# Patient Record
Sex: Female | Born: 1990 | Race: Black or African American | Hispanic: No | Marital: Married | Smoking: Never smoker
Health system: Southern US, Community
[De-identification: ages and names within clinical notes are randomized; demographics above are authoritative.]

## PROBLEM LIST (undated history)

## (undated) ENCOUNTER — Inpatient Hospital Stay: Payer: Self-pay

## (undated) DIAGNOSIS — C50919 Malignant neoplasm of unspecified site of unspecified female breast: Secondary | ICD-10-CM

## (undated) DIAGNOSIS — D649 Anemia, unspecified: Secondary | ICD-10-CM

## (undated) DIAGNOSIS — J309 Allergic rhinitis, unspecified: Secondary | ICD-10-CM

## (undated) DIAGNOSIS — Z17421 Hormone receptor negative with human epidermal growth factor receptor 2 negative status: Secondary | ICD-10-CM

## (undated) DIAGNOSIS — I7300 Raynaud's syndrome without gangrene: Secondary | ICD-10-CM

## (undated) DIAGNOSIS — E538 Deficiency of other specified B group vitamins: Secondary | ICD-10-CM

## (undated) DIAGNOSIS — Z9011 Acquired absence of right breast and nipple: Secondary | ICD-10-CM

## (undated) DIAGNOSIS — R911 Solitary pulmonary nodule: Secondary | ICD-10-CM

## (undated) DIAGNOSIS — D569 Thalassemia, unspecified: Secondary | ICD-10-CM

## (undated) DIAGNOSIS — K769 Liver disease, unspecified: Secondary | ICD-10-CM

## (undated) DIAGNOSIS — D563 Thalassemia minor: Secondary | ICD-10-CM

## (undated) HISTORY — DX: Allergic rhinitis, unspecified: J30.9

## (undated) HISTORY — DX: Raynaud's syndrome without gangrene: I73.00

## (undated) HISTORY — DX: Thalassemia, unspecified: D56.9

## (undated) HISTORY — PX: NO PAST SURGERIES: SHX2092

## (undated) HISTORY — DX: Anemia, unspecified: D64.9

---

## 2008-01-28 ENCOUNTER — Emergency Department: Payer: Self-pay | Admitting: Emergency Medicine

## 2008-09-23 ENCOUNTER — Emergency Department: Payer: Self-pay | Admitting: Emergency Medicine

## 2008-10-03 ENCOUNTER — Emergency Department: Payer: Self-pay | Admitting: Emergency Medicine

## 2008-10-23 ENCOUNTER — Emergency Department: Payer: Self-pay | Admitting: Emergency Medicine

## 2009-01-03 ENCOUNTER — Ambulatory Visit: Payer: Self-pay | Admitting: Family Medicine

## 2009-05-15 ENCOUNTER — Observation Stay: Payer: Self-pay | Admitting: Obstetrics and Gynecology

## 2009-05-18 ENCOUNTER — Inpatient Hospital Stay: Payer: Self-pay | Admitting: Obstetrics and Gynecology

## 2009-06-20 ENCOUNTER — Emergency Department: Payer: Self-pay | Admitting: Emergency Medicine

## 2009-06-27 ENCOUNTER — Emergency Department: Payer: Self-pay | Admitting: Emergency Medicine

## 2011-01-16 ENCOUNTER — Emergency Department: Payer: Self-pay | Admitting: Unknown Physician Specialty

## 2011-02-18 IMAGING — US US OB US >=[ID] SNGL FETUS
1 series · 17 of 28 positions shown · non-contrast
Comparison: none

REASON FOR EXAM: DATES
COMMENTS:

[Series 1: us ob us >=(id) sngl fetus · 17 of 53 slices shown]
[im 1/53]
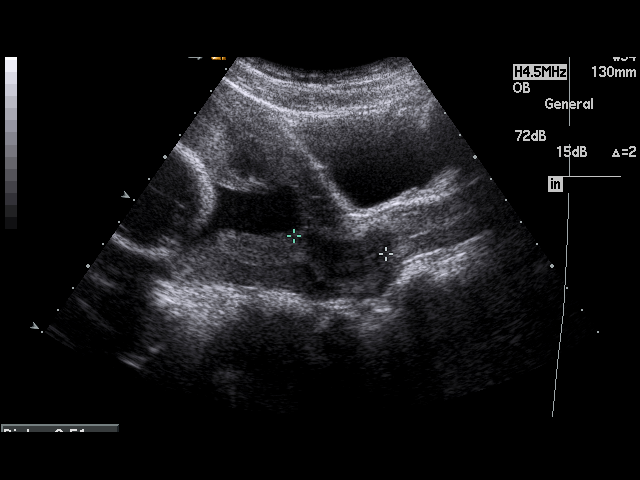
[im 4/53]
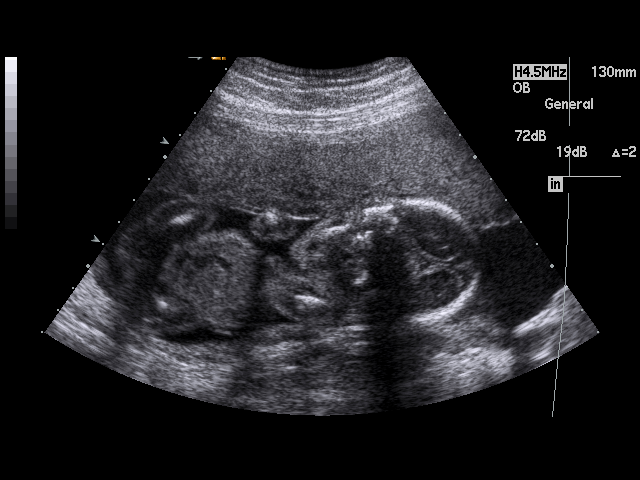
[im 8/53]
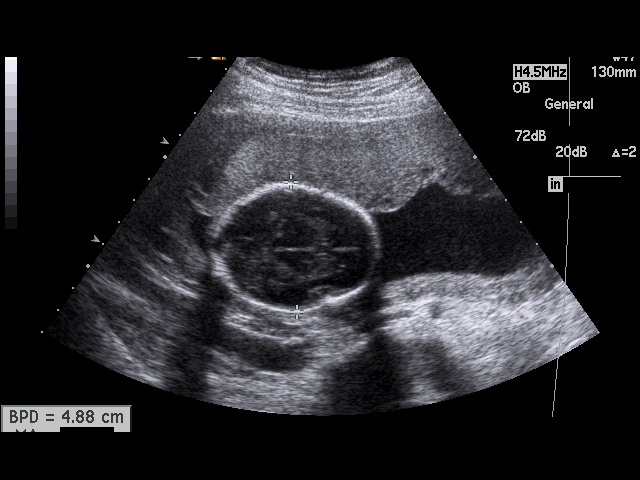
[im 10/53]
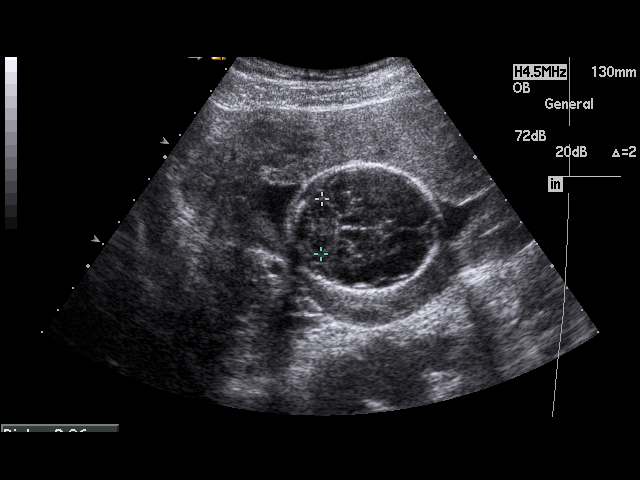
[im 14/53]
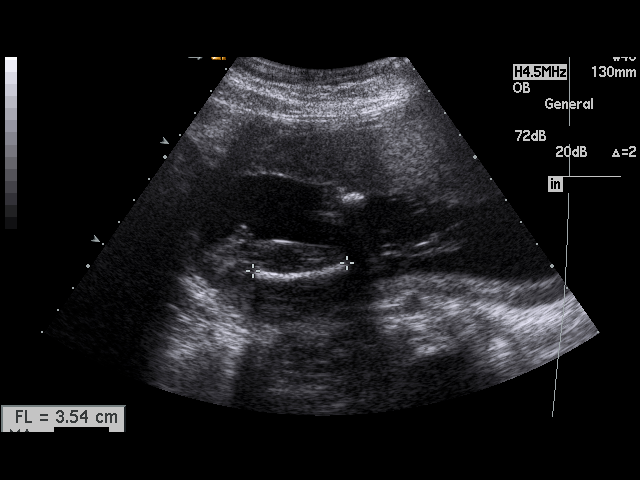
[im 18/53]
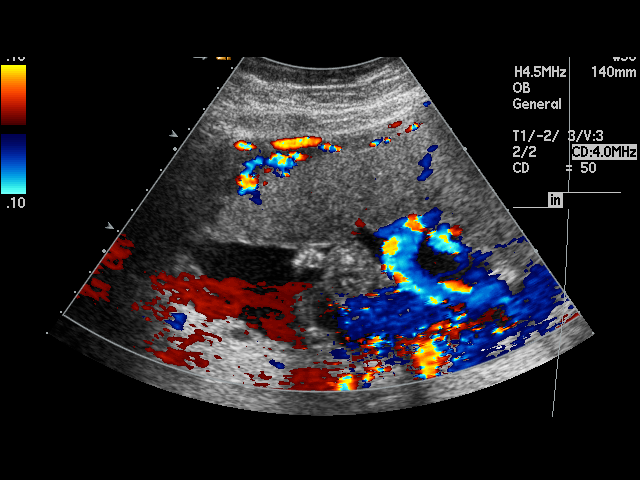
[im 20/53]
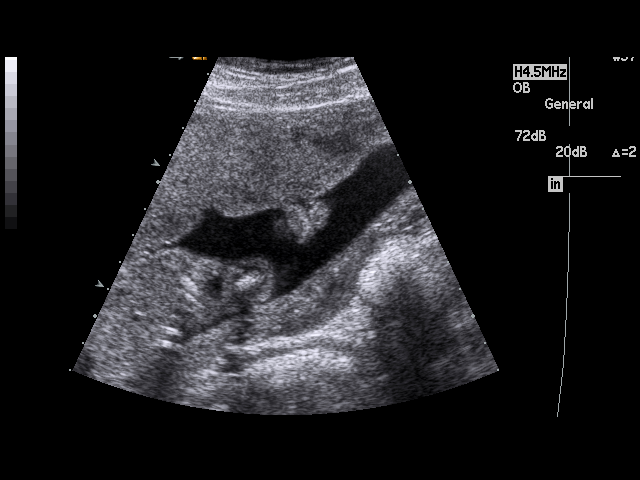
[im 24/53]
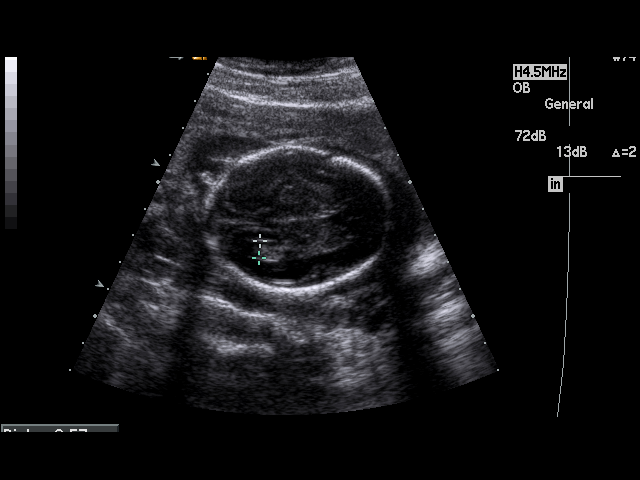
[im 27/53]
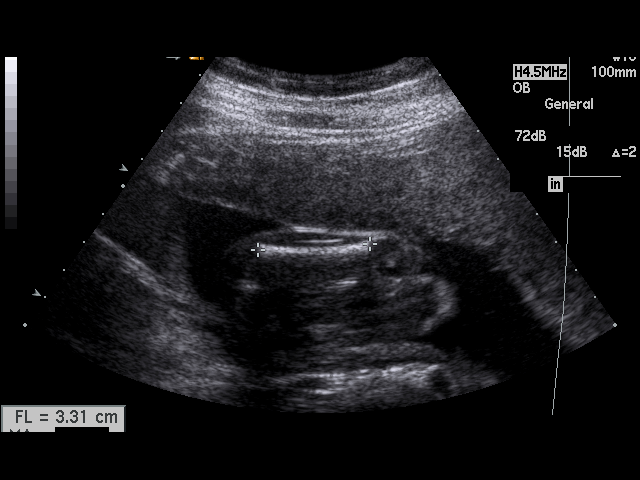
[im 29/53]
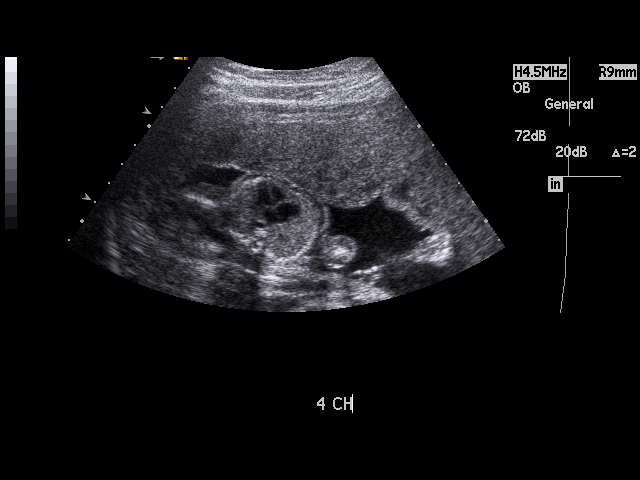
[im 33/53]
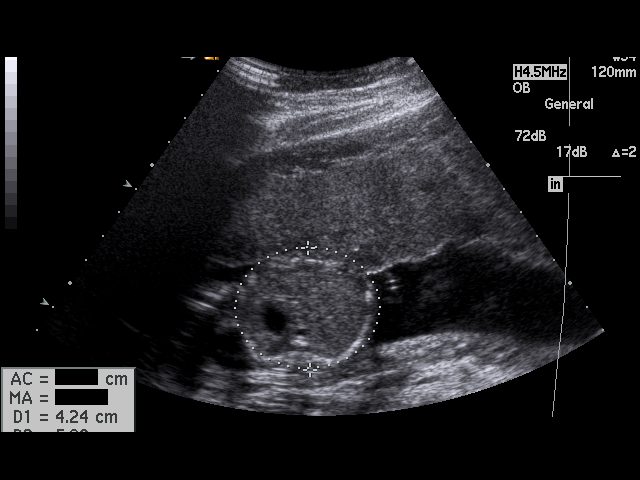
[im 35/53]
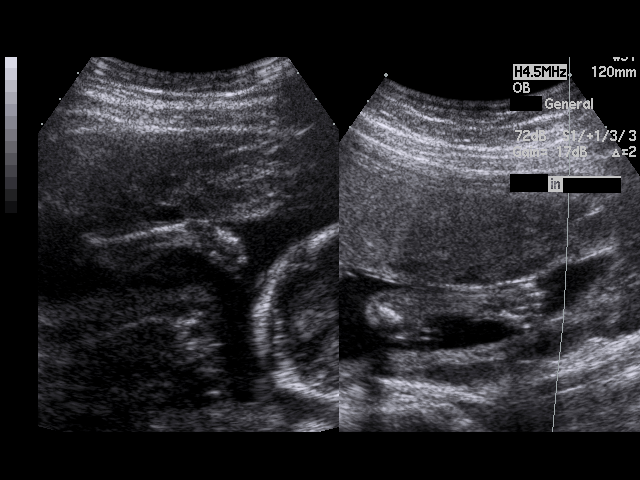
[im 39/53]
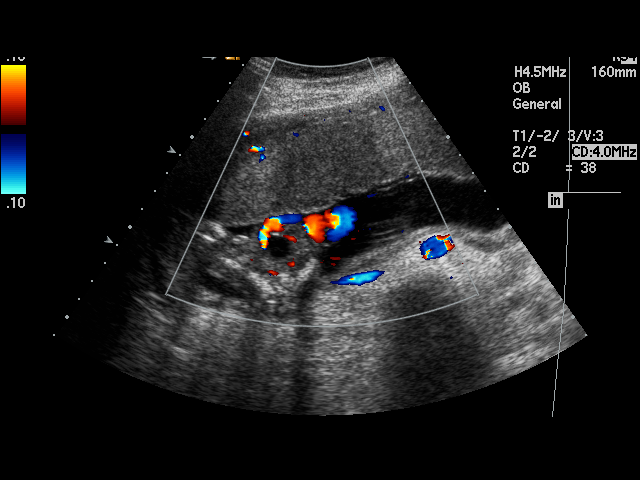
[im 43/53]
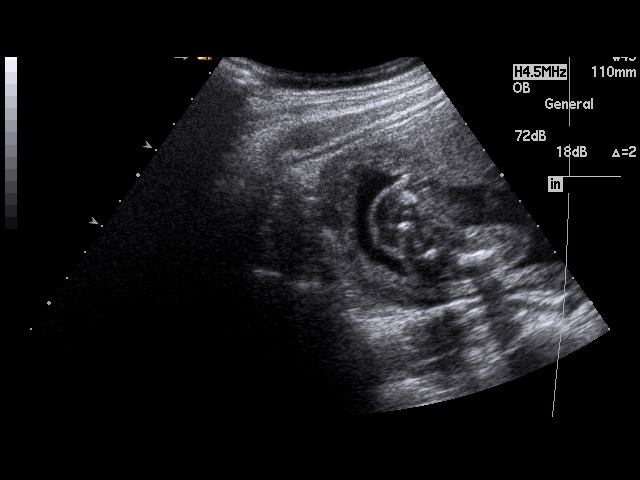
[im 45/53]
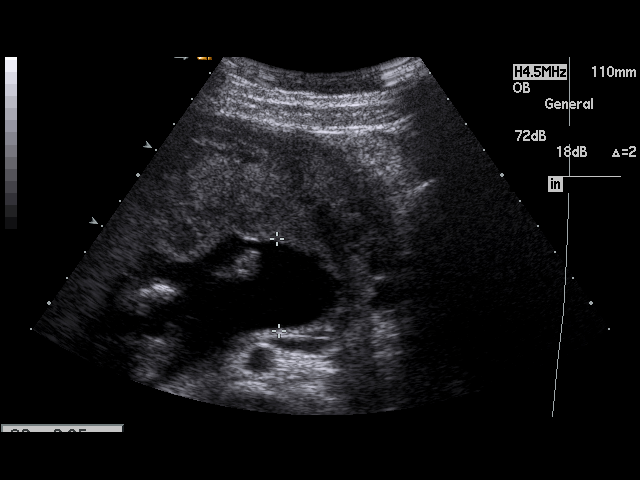
[im 49/53]
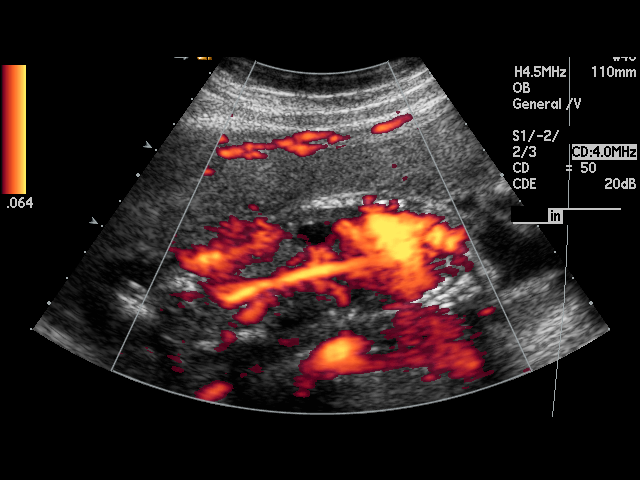
[im 53/53]
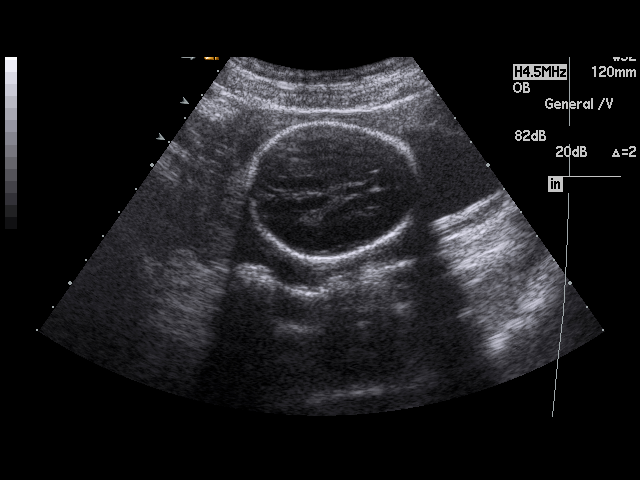

[17 of 28 positions shown; findings below may reference images not displayed]

PROCEDURE:     US  - US OB GREATER/OR EQUAL TO 5T80M  - January 03, 2009  [DATE]

RESULT:     There is a viable IUP with cephalic presentation. The placenta
is anterior with no evidence of previa. The amniotic fluid volume is
estimated to be normal. Fetal cardiac activity is rate of 156 beats per
minute was demonstrated.

The fetal stomach, kidneys, and four-chamber heart were demonstrated and
appeared normal. Evaluation of the spine was limited but no gross
abnormalities were demonstrated. The intracranial structures appear normal.

Measured parameters:
BPD 4.86 cm corresponding to an EGA of 20 weeks 5 days
HC 17.97 cm corresponding to an EGA of 20 weeks 3 days
AC 14.9 cm corresponding to an EGA of 20 weeks one day
FL 3.42 cm corresponding to an EGA of 20 weeks 5 days

The estimated fetal weight is 378 grams + / - 51 grams. No fetal anomalies are
identified.
IMPRESSION: There is a viable IUP with cephalic presentation and
estimated gestational age of 20 weeks 4 days plus or minus approximately 12
days. The estimated date of confinement is 19 May, 2009.

## 2011-04-11 ENCOUNTER — Encounter: Payer: Self-pay | Admitting: Obstetrics & Gynecology

## 2011-06-27 ENCOUNTER — Observation Stay: Payer: Self-pay | Admitting: Obstetrics and Gynecology

## 2011-06-27 LAB — URINALYSIS, COMPLETE
Bilirubin,UR: NEGATIVE
Blood: NEGATIVE
Glucose,UR: NEGATIVE mg/dL (ref 0–75)
Ketone: NEGATIVE
Nitrite: NEGATIVE
Protein: 100
RBC,UR: 1 /HPF (ref 0–5)
WBC UR: 10 /HPF (ref 0–5)

## 2011-08-08 ENCOUNTER — Ambulatory Visit: Payer: Self-pay | Admitting: Oncology

## 2011-08-08 LAB — CBC CANCER CENTER
Basophil %: 0.5 %
Eosinophil #: 0.1 x10 3/mm (ref 0.0–0.7)
HCT: 27.4 % — ABNORMAL LOW (ref 35.0–47.0)
HGB: 8.6 g/dL — ABNORMAL LOW (ref 12.0–16.0)
Lymphocyte %: 21.7 %
MCH: 21.8 pg — ABNORMAL LOW (ref 26.0–34.0)
MCV: 70 fL — ABNORMAL LOW (ref 80–100)
Monocyte %: 9.1 %
Neutrophil %: 66.8 %
Platelet: 288 x10 3/mm (ref 150–440)
RBC: 3.93 10*6/uL (ref 3.80–5.20)
RDW: 14.5 % (ref 11.5–14.5)
WBC: 5.7 x10 3/mm (ref 3.6–11.0)

## 2011-08-08 LAB — IRON AND TIBC: Unbound Iron-Bind.Cap.: 437 ug/dL

## 2011-08-13 ENCOUNTER — Inpatient Hospital Stay: Payer: Self-pay | Admitting: Obstetrics and Gynecology

## 2011-08-13 LAB — CBC WITH DIFFERENTIAL/PLATELET
Basophil #: 0 10*3/uL (ref 0.0–0.1)
Basophil %: 0.6 %
Eosinophil #: 0.1 10*3/uL (ref 0.0–0.7)
Eosinophil %: 1.1 %
HCT: 29.9 % — ABNORMAL LOW (ref 35.0–47.0)
HGB: 9.5 g/dL — ABNORMAL LOW (ref 12.0–16.0)
Lymphocyte #: 1.4 10*3/uL (ref 1.0–3.6)
Lymphocyte %: 21 %
MCH: 22.6 pg — ABNORMAL LOW (ref 26.0–34.0)
MCHC: 31.8 g/dL — ABNORMAL LOW (ref 32.0–36.0)
MCV: 71 fL — ABNORMAL LOW (ref 80–100)
Monocyte #: 0.5 x10 3/mm (ref 0.2–0.9)
Monocyte %: 7 %
Neutrophil #: 4.8 10*3/uL (ref 1.4–6.5)
Neutrophil %: 70.3 %
Platelet: 292 10*3/uL (ref 150–440)
RBC: 4.22 10*6/uL (ref 3.80–5.20)
RDW: 14.8 % — ABNORMAL HIGH (ref 11.5–14.5)
WBC: 6.9 10*3/uL (ref 3.6–11.0)

## 2011-08-14 LAB — HEMOGLOBIN: HGB: 8.8 g/dL — ABNORMAL LOW (ref 12.0–16.0)

## 2011-08-21 ENCOUNTER — Ambulatory Visit: Payer: Self-pay | Admitting: Oncology

## 2013-05-26 IMAGING — US US OB DETAIL+14 WK - NRPT [facility]
1 series · 7 of 7 positions shown · non-contrast
Comparison: none

[Series 1: us ob detail+14 wk - nrpt [facility] · 7 of 7 slices shown]
[im 1/7]
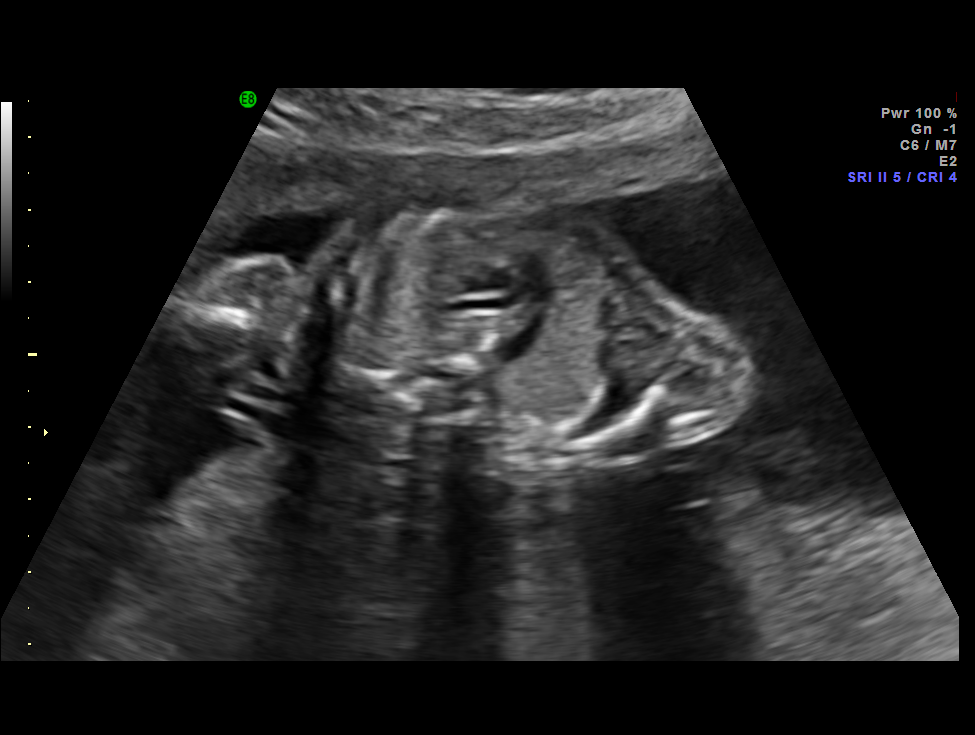
[im 2/7]
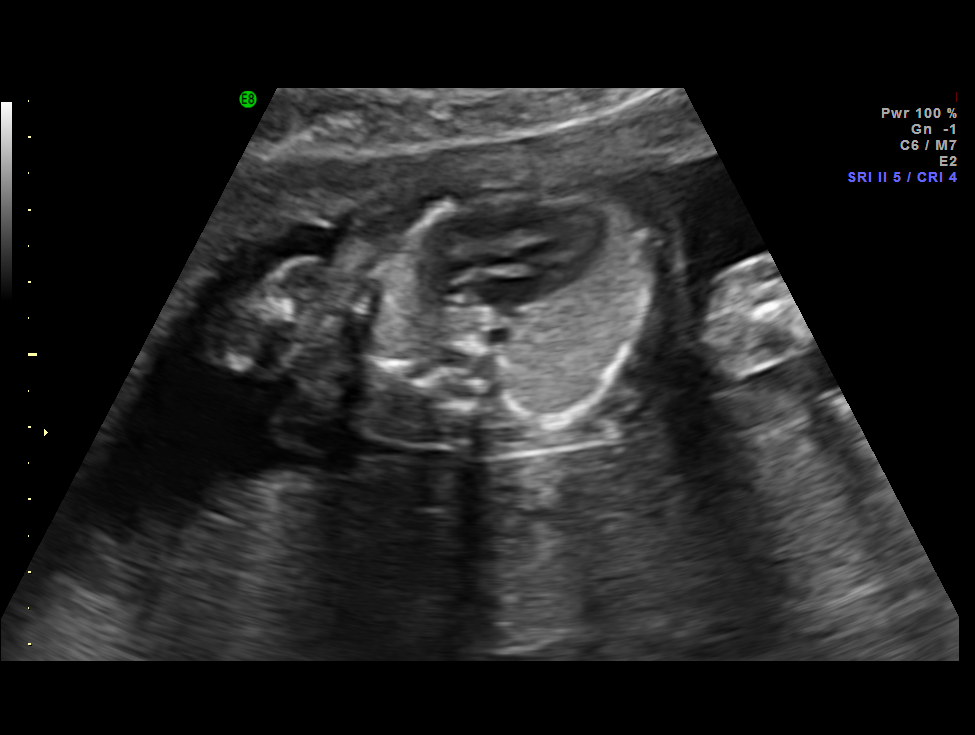
[im 3/7]
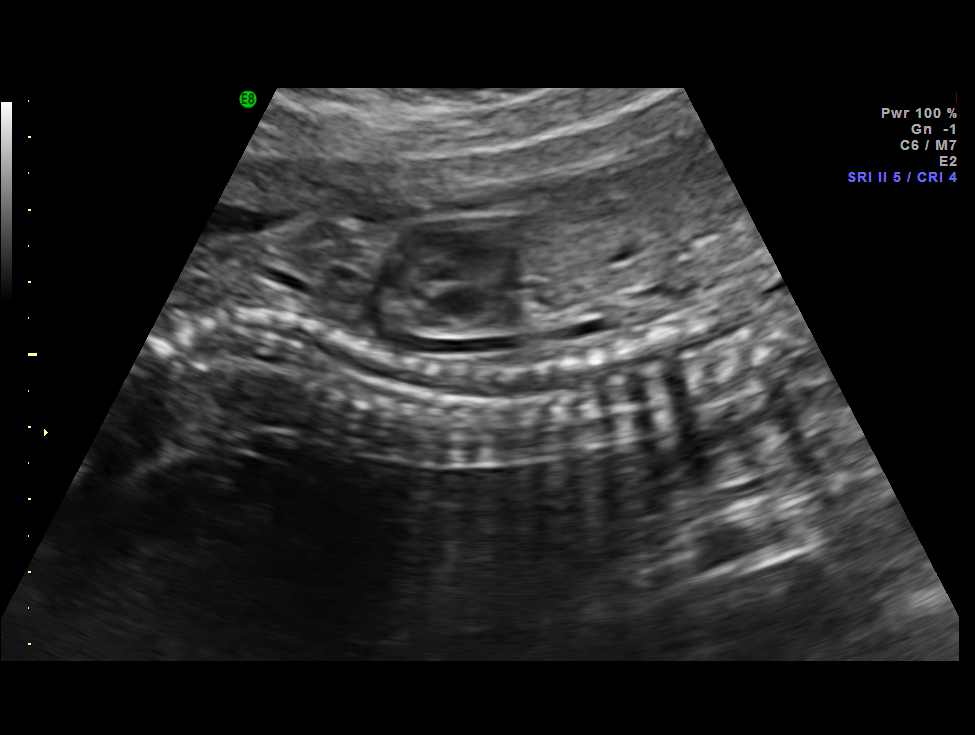
[im 4/7]
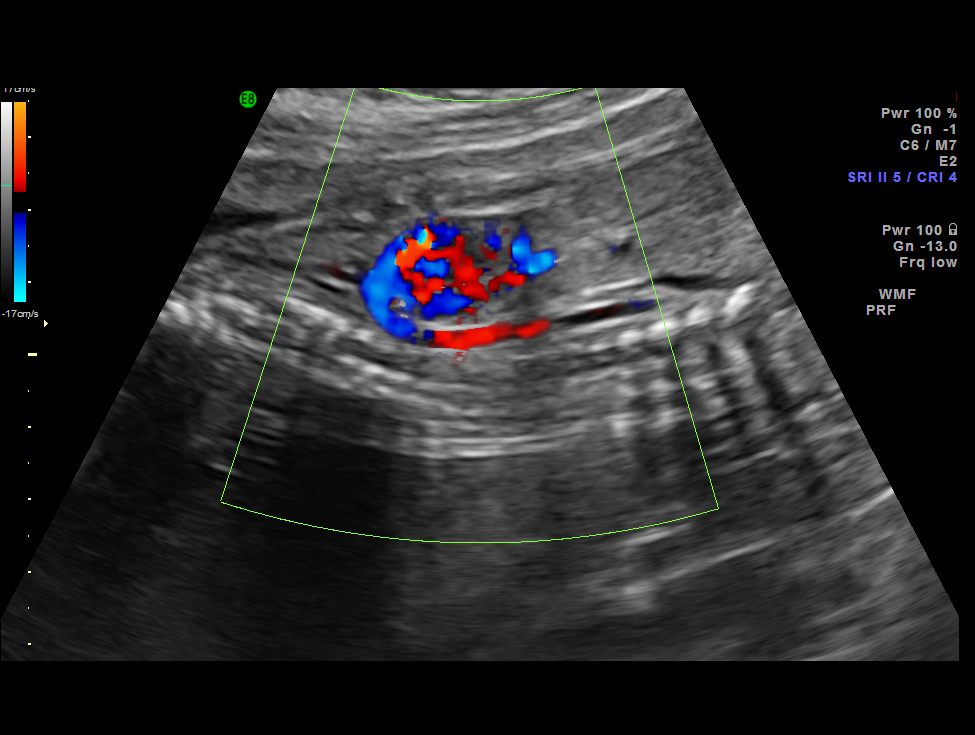
[im 5/7]
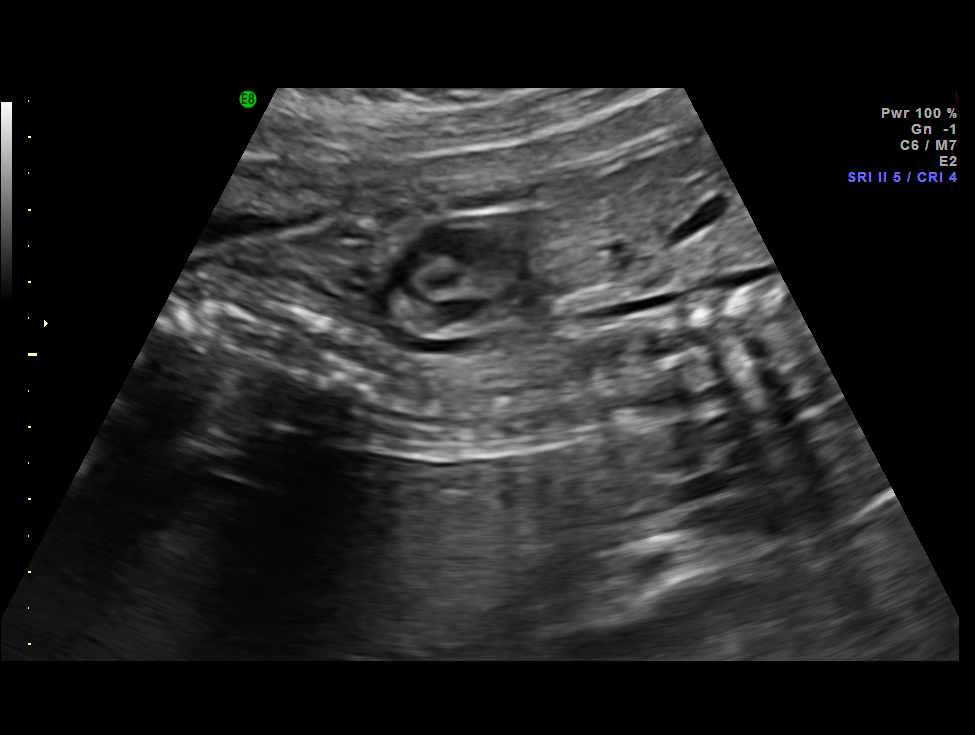
[im 6/7]
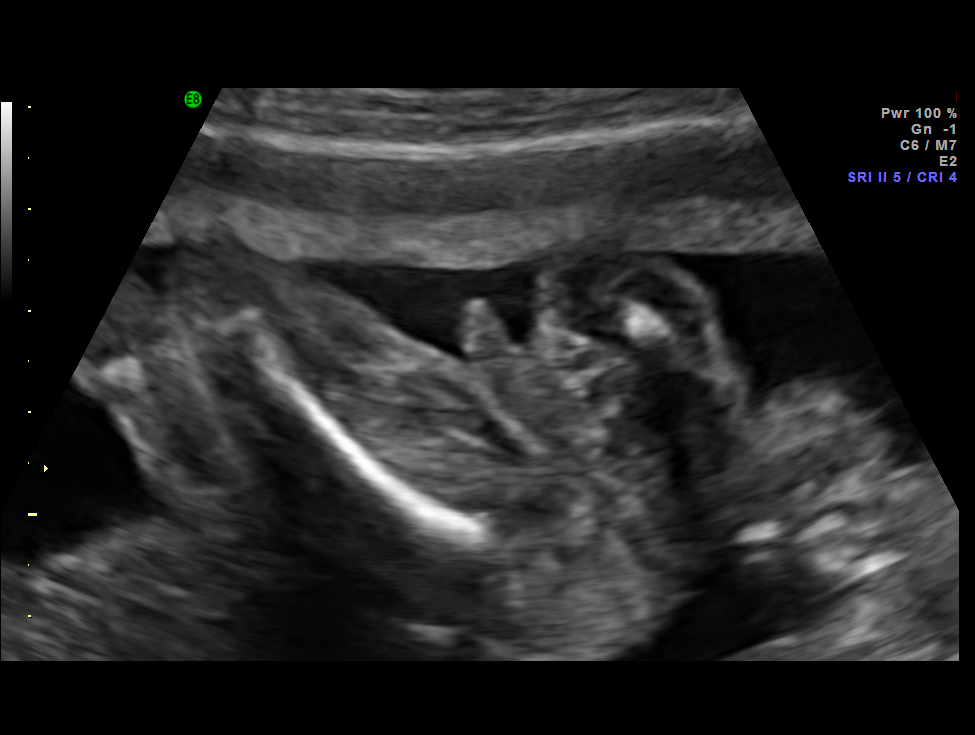
[im 7/7]
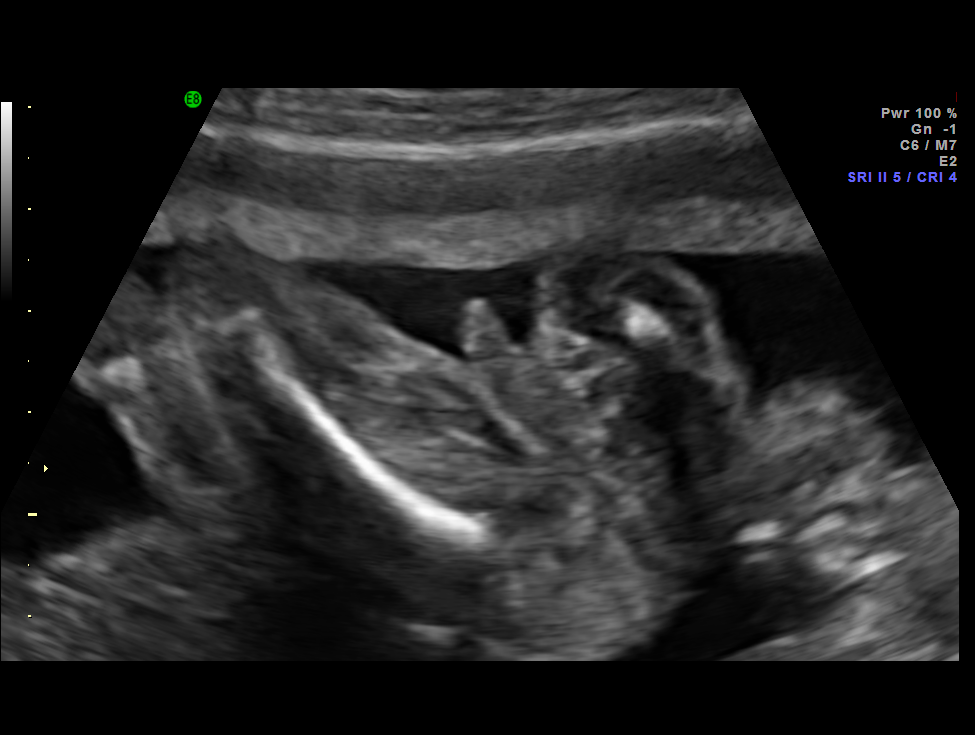

[7 of 7 positions shown; findings below may reference images not displayed]

IMAGES IMPORTED FROM THE [employer] WORKFLOW SYSTEM
NO DICTATION FOR STUDY

## 2014-07-21 ENCOUNTER — Emergency Department
Admit: 2014-07-21 | Disposition: A | Discharge status: Home / Self Care | Payer: Self-pay | Admitting: Emergency Medicine

## 2014-08-30 NOTE — H&P (Signed)
L&D Evaluation:  History:   HPI 24 y/o BF EDC 5/10  LMP 11/23/10    Presents with contractions, leaking fluid    Patient's Medical History GBS+    Patient's Surgical History none    Medications Pre Natal Vitamins    Allergies NKDA    Social History none    Family History Non-Contributory   ROS:   ROS All systems were reviewed.  HEENT, CNS, GI, GU, Respiratory, CV, Renal and Musculoskeletal systems were found to be normal.   Exam:   Vital Signs stable    Abdomen gravid, non-tender    Estimated Fetal Weight Average for gestational age    Back no CVAT    Pelvic 3/70    Mebranes Ruptured    FHT normal rate with no decels    Ucx irregular   Impression:   Impression early labor, SROM, +GBS   Plan:   Plan monitor contractions and for cervical change, antibiotics for GBBS prophylaxis   Electronic Signatures: Edison Nasuti (MD)  (Signed 23-Apr-13 13:42)  Authored: L&D Evaluation   Last Updated: 23-Apr-13 13:42 by Edison Nasuti (MD)

## 2016-09-04 IMAGING — CR DG CHEST 2V
1 series · 2 of 2 positions shown · non-contrast
Comparison: None.

CLINICAL DATA: Persistent productive cough.  Shortness of breath.

EXAM:
CHEST  2 VIEW

[Series 1: dxr chest pa (or ap) and lateral · 0.14mm/px · 2 of 2 slices shown]
[im 1/2]
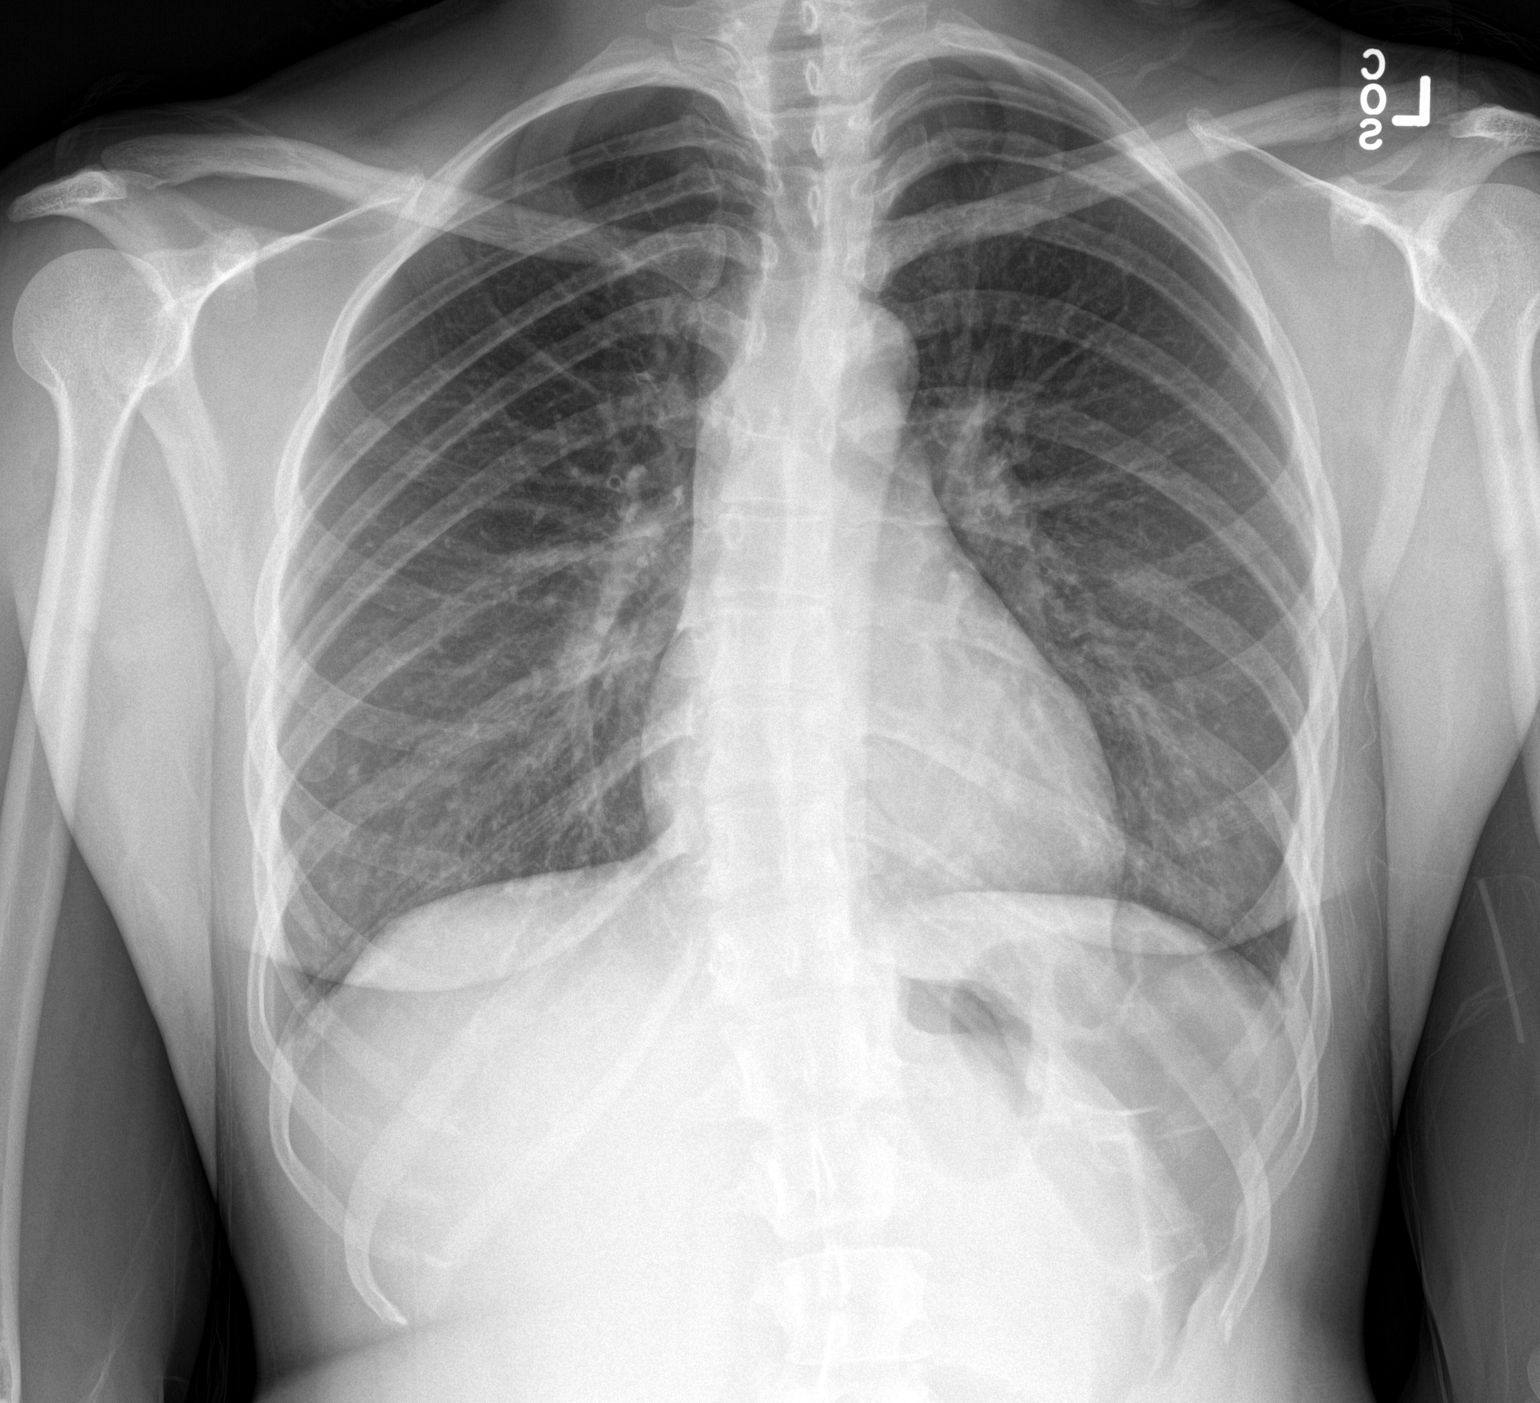
[im 2/2]
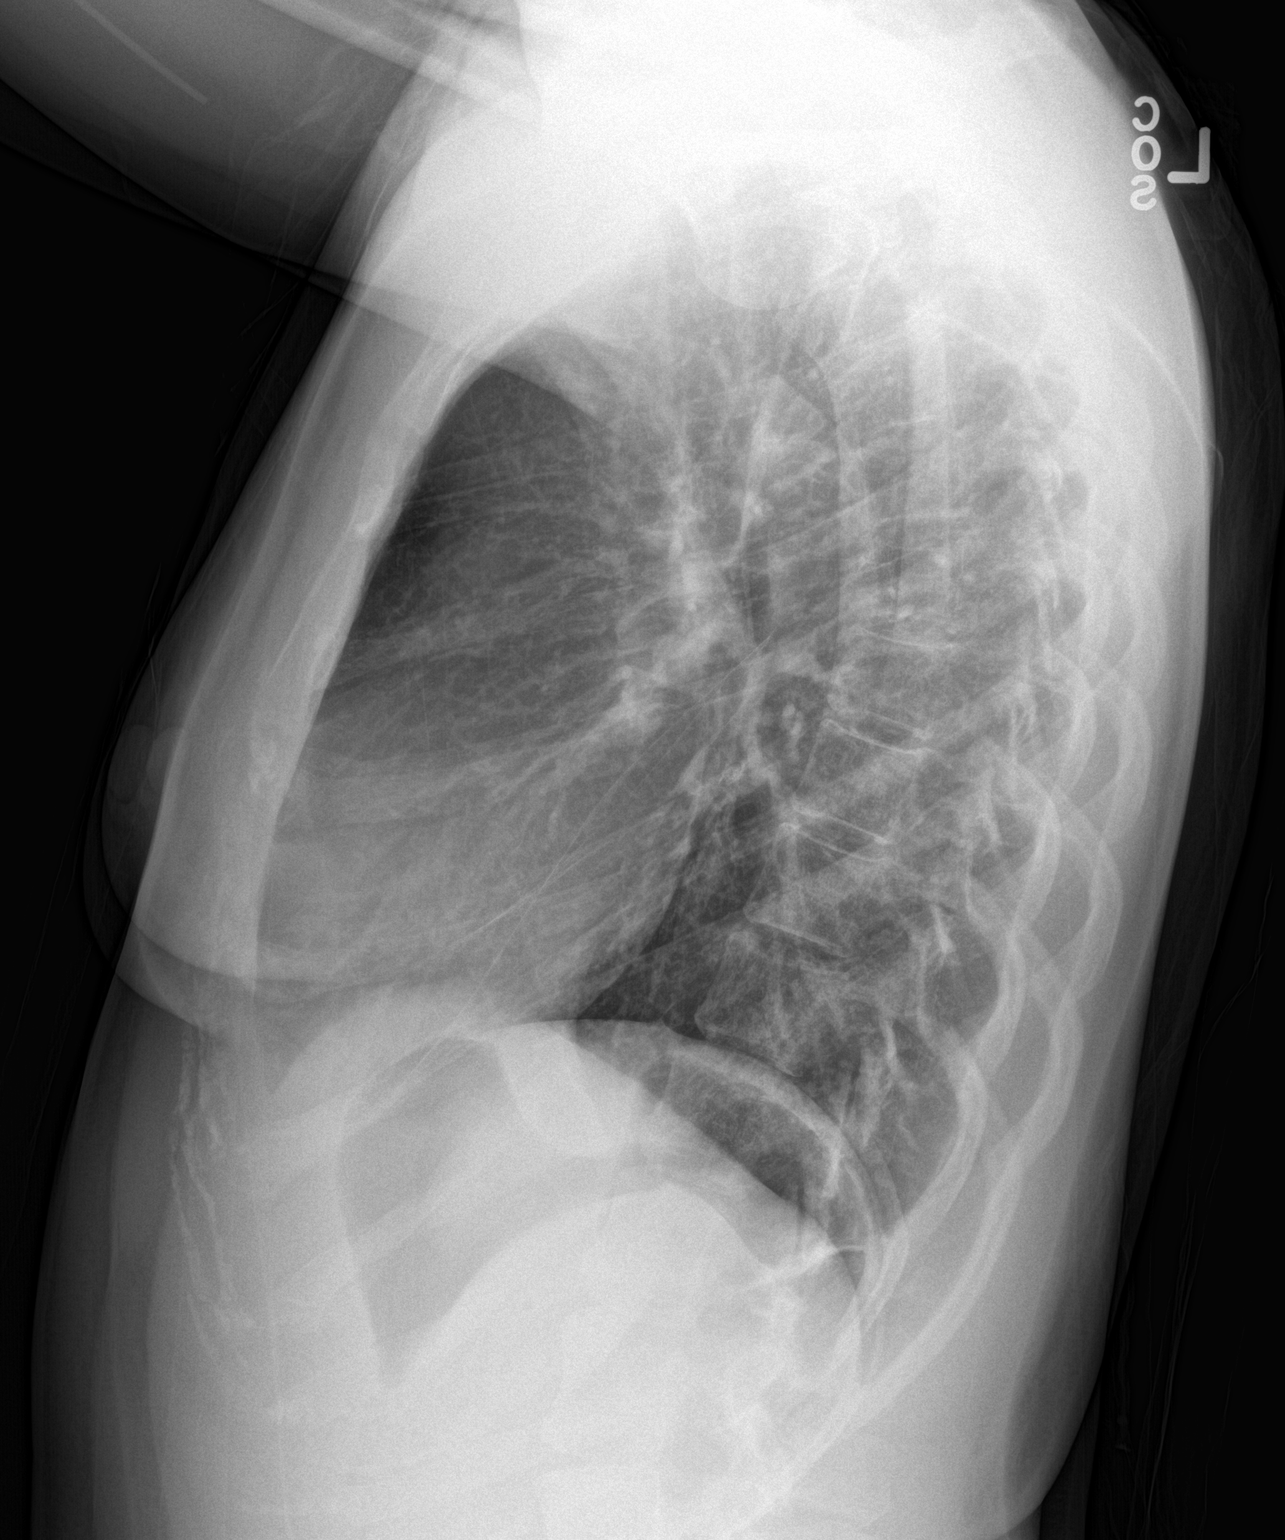

[2 of 2 positions shown; findings below may reference images not displayed]

FINDINGS: There is slight peribronchial thickening. Lungs are otherwise clear.
Heart size and vascularity are normal. No effusions. Slight
thoracolumbar scoliosis.
IMPRESSION: Bronchitic changes.

## 2017-04-16 ENCOUNTER — Emergency Department
Admission: EM | Admit: 2017-04-16 | Discharge: 2017-04-16 | Disposition: A | Discharge status: Home / Self Care | Payer: Self-pay | Attending: Emergency Medicine | Admitting: Emergency Medicine

## 2017-04-16 ENCOUNTER — Other Ambulatory Visit: Payer: Self-pay

## 2017-04-16 DIAGNOSIS — N3001 Acute cystitis with hematuria: Secondary | ICD-10-CM | POA: Insufficient documentation

## 2017-04-16 LAB — URINALYSIS, COMPLETE (UACMP) WITH MICROSCOPIC
BACTERIA UA: NONE SEEN
Bilirubin Urine: NEGATIVE
GLUCOSE, UA: NEGATIVE mg/dL
KETONES UR: NEGATIVE mg/dL
NITRITE: NEGATIVE
PROTEIN: NEGATIVE mg/dL
Specific Gravity, Urine: 1.009 (ref 1.005–1.030)
pH: 6 (ref 5.0–8.0)

## 2017-04-16 LAB — POCT PREGNANCY, URINE: PREG TEST UR: NEGATIVE

## 2017-04-16 MED ORDER — PHENAZOPYRIDINE HCL 100 MG PO TABS: 100.0000 mg | ORAL_TABLET | Freq: Three times a day (TID) | ORAL | 0 refills | Status: DC | PRN

## 2017-04-16 MED ORDER — SULFAMETHOXAZOLE-TRIMETHOPRIM 800-160 MG PO TABS: 1.0000 | ORAL_TABLET | Freq: Two times a day (BID) | ORAL | 0 refills | Status: DC

## 2017-04-16 NOTE — ED Notes (Signed)
Pt discharged home after verbalizing understanding of discharge instructions; nad noted. 

## 2017-04-16 NOTE — ED Notes (Signed)
Pt presents with urinary frequency and pain x 2 days. Denies hx of uti, other symptoms. Pt alert & oriented with NAD noted.

## 2017-04-16 NOTE — ED Triage Notes (Signed)
Pt c/o increased urinary frequency with painful urination for the past 2 days.

## 2017-04-16 NOTE — ED Provider Notes (Signed)
[facility] Emergency Department Provider Note   ____________________________________________   First MD Initiated Contact with Patient 04/16/17 [phone]     (approximate)  I have reviewed the triage vital signs and the nursing notes.   HISTORY  Chief Complaint Urinary Frequency   HPI Angel Pacheco is a 26 y.o. female is here with complaint of urinary frequency beginning 2 days ago. Patient states she is also having pain with urination. She states that it one year ago she did have a urinary tract infection. She states this feels very similar. She denies any fever, chills, nausea or vomiting. She denies any back pain.she rates her pain as 6/10.   History reviewed. No pertinent past medical history.  There are no active problems to display for this patient.   History reviewed. No pertinent surgical history.  Prior to Admission medications   Medication Sig Start Date End Date Taking? Authorizing Provider  phenazopyridine (PYRIDIUM) 100 MG tablet Take 1 tablet (100 mg total) by mouth 3 (three) times daily as needed for pain. 04/16/17 04/16/18  Johnn Hai, PA-C  sulfamethoxazole-trimethoprim (BACTRIM DS,SEPTRA DS) 800-160 MG tablet Take 1 tablet by mouth 2 (two) times daily. 04/16/17   Johnn Hai, PA-C    Allergies Patient has no known allergies.  No family history on file.  Social History Social History   Tobacco Use  . Smoking status: Never Smoker  . Smokeless tobacco: Never Used  Substance Use Topics  . Alcohol use: No    Frequency: Never  . Drug use: No    Review of Systems Constitutional: No fever/chills Cardiovascular: Denies chest pain. Respiratory: Denies shortness of breath. Gastrointestinal: No abdominal pain.  No nausea, no vomiting.   Genitourinary: positive for dysuria. Musculoskeletal: Negative for back pain. Skin: Negative for rash. Neurological: Negative for  headaches. ___________________________________________   PHYSICAL EXAM:  VITAL SIGNS: ED Triage Vitals [04/16/17 0812]  Enc Vitals Group     BP 134/84     Pulse Rate 79     Resp 17     Temp 97.9 F (36.6 C)     Temp Source Oral     SpO2 100 %     Weight 125 lb (56.7 kg)     Height 5\' 1"  (1.549 m)     Head Circumference      Peak Flow      Pain Score 6     Pain Loc      Pain Edu?      Excl. in [place]?    Constitutional: Alert and oriented. Well appearing and in no acute distress. Eyes: Conjunctivae are normal.  Head: Atraumatic. Neck: No stridor.   Cardiovascular: Normal rate, regular rhythm. Grossly normal heart sounds.  Good peripheral circulation. Respiratory: Normal respiratory effort.  No retractions. Lungs CTAB. Gastrointestinal: Soft and nontender. No distention.  No CVA tenderness. Musculoskeletal: normal gait was noted. Neurologic:  Normal speech and language. No gross focal neurologic deficits are appreciated.  Skin:  Skin is warm, dry and intact. No rash noted. Psychiatric: Mood and affect are normal. Speech and behavior are normal.  ____________________________________________   LABS (all labs ordered are listed, but only abnormal results are displayed)  Labs Reviewed  URINALYSIS, COMPLETE (UACMP) WITH MICROSCOPIC - Abnormal; Notable for the following components:      Result Value   Color, Urine YELLOW (*)    APPearance CLEAR (*)    Hgb urine dipstick MODERATE (*)    Leukocytes, UA LARGE (*)  Squamous Epithelial / LPF 0-5 (*)    All other components within normal limits  POC URINE PREG, ED  POCT PREGNANCY, URINE    PROCEDURES  Procedure(s) performed: None  Procedures  Critical Care performed: No  ____________________________________________   INITIAL IMPRESSION / ASSESSMENT AND PLAN / ED COURSE Patient is given a prescription for Bactrim DS twice a day for 10 days and Pyridium 100 mg 1 tablet  3 times a day for dysuria. Patient is aware that  this will discolor her urine. She'll increase fluids and take Tylenol if needed for pain. She'll return to the emergency department if any severe worsening of her symptoms. ____________________________________________   FINAL CLINICAL IMPRESSION(S) / ED DIAGNOSES  Final diagnoses:  Acute cystitis with hematuria     ED Discharge Orders        Ordered    sulfamethoxazole-trimethoprim (BACTRIM DS,SEPTRA DS) 800-160 MG tablet  2 times daily     04/16/17 0913    phenazopyridine (PYRIDIUM) 100 MG tablet  3 times daily PRN     04/16/17 0913       Note:  This document was prepared using Dragon voice recognition software and may include unintentional dictation errors.    Johnn Hai, PA-C 04/16/17 1334    Harvest Dark, MD 04/16/17 1836

## 2017-04-16 NOTE — Discharge Instructions (Signed)
Increase fluids. Begin taking antibiotic as directed for the next 10 days. Pyridium 1 tablet 3 times a day which will follow up with frequency and pain. This medication will cause your urine to discolor. This is temporary.  Follow-up with your primary care provider or [facility] if any continued problems.

## 2017-10-03 ENCOUNTER — Encounter: Payer: Self-pay | Admitting: Emergency Medicine

## 2017-10-03 ENCOUNTER — Other Ambulatory Visit: Payer: Self-pay

## 2017-10-03 ENCOUNTER — Emergency Department
Admission: EM | Admit: 2017-10-03 | Discharge: 2017-10-03 | Disposition: A | Discharge status: Home / Self Care | Payer: Self-pay | Attending: Emergency Medicine | Admitting: Emergency Medicine

## 2017-10-03 DIAGNOSIS — N3001 Acute cystitis with hematuria: Secondary | ICD-10-CM | POA: Insufficient documentation

## 2017-10-03 LAB — URINALYSIS, COMPLETE (UACMP) WITH MICROSCOPIC
BILIRUBIN URINE: NEGATIVE
GLUCOSE, UA: NEGATIVE mg/dL
KETONES UR: NEGATIVE mg/dL
Nitrite: NEGATIVE
PH: 6 (ref 5.0–8.0)
Protein, ur: NEGATIVE mg/dL
SPECIFIC GRAVITY, URINE: 1.001 — AB (ref 1.005–1.030)

## 2017-10-03 LAB — POCT PREGNANCY, URINE: Preg Test, Ur: NEGATIVE

## 2017-10-03 MED ORDER — CEPHALEXIN 500 MG PO CAPS: 500.0000 mg | ORAL_CAPSULE | Freq: Two times a day (BID) | ORAL | 0 refills | Status: DC

## 2017-10-03 MED ORDER — CEPHALEXIN 500 MG PO CAPS: 500.0000 mg | ORAL_CAPSULE | Freq: Two times a day (BID) | ORAL | 0 refills | Status: AC

## 2017-10-03 MED ORDER — CEPHALEXIN 500 MG PO CAPS
500.0000 mg | ORAL_CAPSULE | Freq: Once | ORAL | Status: AC
  Administered 2017-10-03: 500 mg via ORAL
  Filled 2017-10-03: qty 1

## 2017-10-03 MED ORDER — PHENAZOPYRIDINE HCL 100 MG PO TABS
95.0000 mg | ORAL_TABLET | Freq: Once | ORAL | Status: AC
  Administered 2017-10-03: 100 mg via ORAL
  Filled 2017-10-03: qty 1

## 2017-10-03 MED ORDER — PHENAZOPYRIDINE HCL 100 MG PO TABS
100.0000 mg | ORAL_TABLET | Freq: Three times a day (TID) | ORAL | 0 refills | Status: AC | PRN
Start: 2017-10-03 — End: 2017-10-05

## 2017-10-03 MED ORDER — PHENAZOPYRIDINE HCL 200 MG PO TABS
ORAL_TABLET | ORAL | Status: AC
  Filled 2017-10-03: qty 1

## 2017-10-03 MED ORDER — PHENAZOPYRIDINE HCL 100 MG PO TABS: 100.0000 mg | ORAL_TABLET | Freq: Three times a day (TID) | ORAL | 0 refills | Status: DC | PRN

## 2017-10-03 NOTE — ED Notes (Signed)
See triage note  Thinks she has a UTI  Developed some dysuria and freq couple of days ago

## 2017-10-03 NOTE — ED Provider Notes (Signed)
[facility] Emergency Department Provider Note  ____________________________________________  Time seen: Approximately 6:20 PM  I have reviewed the triage vital signs and the nursing notes.   HISTORY  Chief Complaint No chief complaint on file.    HPI Angel Pacheco is a 27 y.o. female presents emergency department for evaluation of dysuria, urgency, frequency for 4 days.  Patient noticed blood in her urine this morning.  Last menstrual period was last month.  She has been using a new feminine wash.  She has never had a kidney stone.  She feels like she has a urinary tract infection.  Last UTI was in December and was "worse." No fever, chills, nausea, vomiting, abdominal pain, vaginal discharge.  History reviewed. No pertinent past medical history.  There are no active problems to display for this patient.   History reviewed. No pertinent surgical history.  Prior to Admission medications   Medication Sig Start Date End Date Taking? Authorizing Provider  cephALEXin (KEFLEX) 500 MG capsule Take 1 capsule (500 mg total) by mouth 2 (two) times daily for 10 days. 10/03/17 10/13/17  Laban Emperor, PA-C  phenazopyridine (PYRIDIUM) 100 MG tablet Take 1 tablet (100 mg total) by mouth 3 (three) times daily as needed for up to 2 days for pain. 10/03/17 10/05/17  Laban Emperor, PA-C  sulfamethoxazole-trimethoprim (BACTRIM DS,SEPTRA DS) 800-160 MG tablet Take 1 tablet by mouth 2 (two) times daily. 04/16/17   Johnn Hai, PA-C    Allergies Patient has no known allergies.  No family history on file.  Social History Social History   Tobacco Use  . Smoking status: Never Smoker  . Smokeless tobacco: Never Used  Substance Use Topics  . Alcohol use: No    Frequency: Never  . Drug use: No     Review of Systems  Constitutional: No fever/chills Gastrointestinal: No abdominal pain.  No nausea, no vomiting.  Musculoskeletal: Negative for musculoskeletal  pain. Skin: Negative for rash, abrasions, lacerations, ecchymosis.   ____________________________________________   PHYSICAL EXAM:  VITAL SIGNS: ED Triage Vitals  Enc Vitals Group     BP 10/03/17 1657 (!) 147/98     Pulse Rate 10/03/17 1657 90     Resp 10/03/17 1657 18     Temp 10/03/17 1657 98.1 F (36.7 C)     Temp Source 10/03/17 1657 Oral     SpO2 10/03/17 1657 100 %     Weight 10/03/17 1651 128 lb (58.1 kg)     Height 10/03/17 1651 5\' 1"  (1.549 m)     Head Circumference --      Peak Flow --      Pain Score 10/03/17 1651 9     Pain Loc --      Pain Edu? --      Excl. in [place]? --      Constitutional: Alert and oriented. Well appearing and in no acute distress. Eyes: Conjunctivae are normal. PERRL. EOMI. Head: Atraumatic. ENT:      Ears:      Nose: No congestion/rhinnorhea.      Mouth/Throat: Mucous membranes are moist.  Neck: No stridor.  Cardiovascular: Normal rate, regular rhythm.  Good peripheral circulation. Respiratory: Normal respiratory effort without tachypnea or retractions. Lungs CTAB. Good air entry to the bases with no decreased or absent breath sounds. Gastrointestinal: Bowel sounds 4 quadrants. Soft and nontender to palpation. No guarding or rigidity. No palpable masses. No distention. No CVA tenderness. Musculoskeletal: Full range of motion to all extremities. No  gross deformities appreciated. Neurologic:  Normal speech and language. No gross focal neurologic deficits are appreciated.  Skin:  Skin is warm, dry and intact. No rash noted. Psychiatric: Mood and affect are normal. Speech and behavior are normal. Patient exhibits appropriate insight and judgement.   ____________________________________________   LABS (all labs ordered are listed, but only abnormal results are displayed)  Labs Reviewed  URINALYSIS, COMPLETE (UACMP) WITH MICROSCOPIC - Abnormal; Notable for the following components:      Result Value   Color, Urine STRAW (*)     APPearance CLEAR (*)    Specific Gravity, Urine 1.001 (*)    Hgb urine dipstick LARGE (*)    Leukocytes, UA MODERATE (*)    Bacteria, UA RARE (*)    All other components within normal limits  URINE CULTURE  POC URINE PREG, ED  POCT PREGNANCY, URINE   ____________________________________________  EKG   ____________________________________________  RADIOLOGY   No results found.  ____________________________________________    PROCEDURES  Procedure(s) performed:    Procedures    Medications  phenazopyridine (PYRIDIUM) 200 MG tablet (has no administration in time range)  phenazopyridine (PYRIDIUM) tablet 100 mg (100 mg Oral Given 10/03/17 1838)  cephALEXin (KEFLEX) capsule 500 mg (500 mg Oral Given 10/03/17 1838)     ____________________________________________   INITIAL IMPRESSION / ASSESSMENT AND PLAN / ED COURSE  Pertinent labs & imaging results that were available during my care of the patient were reviewed by me and considered in my medical decision making (see chart for details).  Review of the [place] CSRS was performed in accordance of the [place] prior to dispensing any controlled drugs.     Patient's diagnosis is consistent with urinary tract infection.  Vital signs and exam are reassuring.  Urinalysis is consistent with infection.  Will be sent for culture.  We discussed imaging for kidney stone but patient is not having any back or abdominal pain and never had a kidney stone.  She will return to the emergency department for any worsening symptoms.  Patient will be discharged home with prescriptions for keflex, pyridium. Patient is to follow up with PCP as directed. Patient is given ED precautions to return to the ED for any worsening or new symptoms.     ____________________________________________  FINAL CLINICAL IMPRESSION(S) / ED DIAGNOSES  Final diagnoses:  Acute cystitis with hematuria      NEW MEDICATIONS STARTED DURING THIS VISIT:  ED  Discharge Orders        Ordered    cephALEXin (KEFLEX) 500 MG capsule  2 times daily,   Status:  Discontinued     10/03/17 1830    phenazopyridine (PYRIDIUM) 100 MG tablet  3 times daily PRN,   Status:  Discontinued     10/03/17 1830    cephALEXin (KEFLEX) 500 MG capsule  2 times daily     10/03/17 1833    phenazopyridine (PYRIDIUM) 100 MG tablet  3 times daily PRN     10/03/17 1833          This chart was dictated using voice recognition software/Dragon. Despite best efforts to proofread, errors can occur which can change the meaning. Any change was purely unintentional.    Laban Emperor, PA-C 10/03/17 1855    Darel Hong, MD 10/03/17 2134

## 2017-10-03 NOTE — ED Triage Notes (Signed)
C/O dysuria, burning with urination, frequency, hematuria.  States symptoms started late Tuesday, and symptoms have worsened.

## 2017-10-06 LAB — URINE CULTURE

## 2019-09-03 ENCOUNTER — Encounter: Payer: Self-pay | Admitting: Family Medicine

## 2019-09-03 ENCOUNTER — Ambulatory Visit (INDEPENDENT_AMBULATORY_CARE_PROVIDER_SITE_OTHER): Payer: Self-pay | Admitting: Family Medicine

## 2019-09-03 ENCOUNTER — Other Ambulatory Visit: Payer: Self-pay

## 2019-09-03 VITALS — BP 137/92 | HR 94 | Temp 97.3°F | Ht 61.0 in | Wt 139.0 lb

## 2019-09-03 DIAGNOSIS — Z0000 Encounter for general adult medical examination without abnormal findings: Secondary | ICD-10-CM | POA: Insufficient documentation

## 2019-09-03 DIAGNOSIS — D563 Thalassemia minor: Secondary | ICD-10-CM | POA: Insufficient documentation

## 2019-09-03 DIAGNOSIS — D569 Thalassemia, unspecified: Secondary | ICD-10-CM | POA: Insufficient documentation

## 2019-09-03 DIAGNOSIS — E663 Overweight: Secondary | ICD-10-CM | POA: Insufficient documentation

## 2019-09-03 DIAGNOSIS — Z114 Encounter for screening for human immunodeficiency virus [HIV]: Secondary | ICD-10-CM

## 2019-09-03 NOTE — Progress Notes (Signed)
I,Angel Pacheco,acting as a scribe for Angel Paganini, MD.,have documented all relevant documentation on the behalf of Angel Paganini, MD,as directed by  Angel Paganini, MD while in the presence of Angel Paganini, MD.  New patient visit - Annual Physical   Patient: Angel Pacheco   DOB: 10-Jun-1990   29 y.o. Female  MRN: [id number] Visit Date: 09/03/2019  Today's healthcare provider: Lavon Paganini, MD    Chief Complaint  Patient presents with  . Establish Care   Subjective    Angel Pacheco is a 29 y.o. female who presents today as a new patient to establish care.  HPI  She has no concerns today Declines pap smear today, but will return for one at a later date  Has nexplanon in place for >3 years - would like replaced.  Past Medical History:  Diagnosis Date  . Allergic rhinitis   . Anemia   . Raynaud phenomenon   . Thalassemia    Past Surgical History:  Procedure Laterality Date  . NO PAST SURGERIES     Family Status  Relation Name Status  . Mother  Alive  . Father  Deceased  . Sister  Alive  . MGM  Alive  . Mat Aunt  (Not Specified)  . Neg Hx  (Not Specified)   Family History  Problem Relation Age of Onset  . Diabetes Mother   . Stroke Father   . Gestational diabetes Sister   . Asthma Maternal Grandmother   . Breast cancer Maternal Aunt        great aunts  . Colon cancer Neg Hx    Social History   Socioeconomic History  . Marital status: Married    Spouse name: Not on file  . Number of children: 2  . Years of education: Not on file  . Highest education level: Not on file  Occupational History  . Occupation: accounting  Tobacco Use  . Smoking status: Never Smoker  . Smokeless tobacco: Never Used  Substance and Sexual Activity  . Alcohol use: No  . Drug use: No  . Sexual activity: Yes    Partners: Male    Birth control/protection: Implant  Other Topics Concern  . Not on file  Social History Narrative  . Not on file    Social Determinants of Health   Financial Resource Strain:   . Difficulty of Paying Living Expenses:   Food Insecurity:   . Worried About Charity fundraiser in the Last Year:   . Arboriculturist in the Last Year:   Transportation Needs:   . Film/video editor (Medical):   Marland Kitchen Lack of Transportation (Non-Medical):   Physical Activity:   . Days of Exercise per Week:   . Minutes of Exercise per Session:   Stress:   . Feeling of Stress :   Social Connections:   . Frequency of Communication with Friends and Family:   . Frequency of Social Gatherings with Friends and Family:   . Attends Religious Services:   . Active Member of Clubs or Organizations:   . Attends Archivist Meetings:   Marland Kitchen Marital Status:    Outpatient Medications Prior to Visit  Medication Sig  . etonogestrel (NEXPLANON) 68 MG IMPL implant 1 each by Subdermal route once.  . [DISCONTINUED] sulfamethoxazole-trimethoprim (BACTRIM DS,SEPTRA DS) 800-160 MG tablet Take 1 tablet by mouth 2 (two) times daily.   No facility-administered medications prior to visit.   No Known Allergies  Immunization History  Administered Date(s) Administered  . Tdap 06/21/2011    Health Maintenance  Topic Date Due  . HIV Screening  Never done  . PAP SMEAR-Modifier  Never done  . INFLUENZA VACCINE  11/21/2019  . TETANUS/TDAP  06/20/2021    Patient Care Team: Angel Crews, MD as PCP - General (Family Medicine)  Review of Systems  Constitutional: Positive for chills and fatigue. Negative for activity change, appetite change, diaphoresis, fever and unexpected weight change.  HENT: Negative.   Eyes: Positive for photophobia and itching. Negative for pain, discharge, redness and visual disturbance.  Respiratory: Negative.   Cardiovascular: Negative.   Gastrointestinal: Negative.   Endocrine: Positive for cold intolerance, heat intolerance and polyuria. Negative for polydipsia and polyphagia.  Genitourinary:  Positive for urgency. Negative for decreased urine volume, difficulty urinating, dyspareunia, dysuria, enuresis, flank pain, frequency, genital sores, hematuria, menstrual problem, pelvic pain, vaginal bleeding, vaginal discharge and vaginal pain.  Musculoskeletal: Negative.   Skin: Negative.   Allergic/Immunologic: Positive for environmental allergies. Negative for food allergies and immunocompromised state.  Neurological: Positive for dizziness and light-headedness. Negative for tremors, seizures, syncope, facial asymmetry, speech difficulty, weakness, numbness and headaches.  Hematological: Negative.   Psychiatric/Behavioral: Negative for agitation, behavioral problems, confusion, decreased concentration, dysphoric mood, hallucinations, self-injury, sleep disturbance and suicidal ideas. The patient is nervous/anxious. The patient is not hyperactive.     Last CBC Lab Results  Component Value Date   WBC 6.9 08/13/2011   HGB 8.8 (L) 08/14/2011   HCT 29.9 (L) 08/13/2011   MCV 71 (L) 08/13/2011   MCH 22.6 (L) 08/13/2011   RDW 14.8 (H) 08/13/2011   PLT 292 [id number]   Last metabolic panel No results found for: GLUCOSE, NA, K, CL, CO2, BUN, CREATININE, GFRNONAA, GFRAA, CALCIUM, PHOS, PROT, ALBUMIN, LABGLOB, AGRATIO, BILITOT, ALKPHOS, AST, ALT, ANIONGAP Last lipids No results found for: CHOL, HDL, LDLCALC, LDLDIRECT, TRIG, CHOLHDL Last hemoglobin A1c No results found for: HGBA1C Last thyroid functions No results found for: TSH, T3TOTAL, T4TOTAL, THYROIDAB  Objective    BP (!) 137/92 (BP Location: Right Arm, Patient Position: Sitting, Cuff Size: Normal)   Pulse 94   Temp (!) 97.3 F (36.3 C) (Temporal)   Ht 5\' 1"  (1.549 m)   Wt 139 lb (63 kg)   BMI 26.26 kg/m  Physical Exam Vitals reviewed.  Constitutional:      General: She is not in acute distress.    Appearance: Normal appearance. She is well-developed. She is not diaphoretic.  HENT:     Head: Normocephalic and atraumatic.      Right Ear: Tympanic membrane, ear canal and external ear normal.     Left Ear: Tympanic membrane, ear canal and external ear normal.     Mouth/Throat:     Pharynx: Uvula midline.  Eyes:     General: Lids are normal. No scleral icterus.    Conjunctiva/sclera: Conjunctivae normal.     Pupils: Pupils are equal, round, and reactive to light.  Neck:     Thyroid: No thyroid mass or thyromegaly.     Vascular: No carotid bruit.     Trachea: Trachea normal.  Cardiovascular:     Rate and Rhythm: Normal rate and regular rhythm.     Pulses: Normal pulses.     Heart sounds: Normal heart sounds. No murmur.  Pulmonary:     Effort: Pulmonary effort is normal. No respiratory distress.     Breath sounds: Normal breath sounds. No wheezing or rales.  Abdominal:  General: There is no distension.     Palpations: Abdomen is soft.     Tenderness: There is no abdominal tenderness. There is no guarding or rebound.  Musculoskeletal:        General: No deformity. Normal range of motion.     Cervical back: Normal range of motion and neck supple.     Right lower leg: No edema.     Left lower leg: No edema.  Lymphadenopathy:     Cervical: No cervical adenopathy.  Skin:    General: Skin is warm and dry.     Findings: No rash.  Neurological:     Mental Status: She is alert and oriented to person, place, and time. Mental status is at baseline.     Cranial Nerves: No cranial nerve deficit.  Psychiatric:        Mood and Affect: Mood normal.        Speech: Speech normal.        Behavior: Behavior normal.        Thought Content: Thought content normal.     Depression Screen PHQ 2/9 Scores 09/03/2019  PHQ - 2 Score 2  PHQ- 9 Score 9   No results found for any visits on 09/03/19.  Assessment & Plan      Problem List Items Addressed This Visit      Other   Overweight (BMI 25.0-29.9)    Discussed importance of healthy weight management Discussed diet and exercise  Screening labs today       Relevant Orders   Comprehensive metabolic panel   Lipid panel   Thalassemia    Asymptomatic Will check CBC      Relevant Orders   CBC with Differential/Platelet   Annual physical exam - Primary    Discussed wellness and screenings Up-to-date on vaccinations Encourage COVID-19 vaccine Encouraged healthy habits Check labs today Pap smear at next visit      Relevant Orders   CBC with Differential/Platelet   Comprehensive metabolic panel   Lipid panel       Return in about 1 month (around 10/04/2019) for Pap smear and Nexplanon removal and reinsertion .     I, Angel Paganini, MD, have reviewed all documentation for this visit. The documentation on 09/03/19 for the exam, diagnosis, procedures, and orders are all accurate and complete.   Bacigalupo, Dionne Bucy, MD, MPH [facility]

## 2019-09-03 NOTE — Assessment & Plan Note (Signed)
Discussed wellness and screenings Up-to-date on vaccinations Encourage COVID-19 vaccine Encouraged healthy habits Check labs today Pap smear at next visit

## 2019-09-03 NOTE — Patient Instructions (Signed)
Preventive Care 21-31 Years Old, Female Preventive care refers to visits with your health care provider and lifestyle choices that can promote health and wellness. This includes:  A yearly physical exam. This may also be called an annual well check.  Regular dental visits and eye exams.  Immunizations.  Screening for certain conditions.  Healthy lifestyle choices, such as eating a healthy diet, getting regular exercise, not using drugs or products that contain nicotine and tobacco, and limiting alcohol use. What can I expect for my preventive care visit? Physical exam Your health care provider will check your:  Height and weight. This may be used to calculate body mass index (BMI), which tells if you are at a healthy weight.  Heart rate and blood pressure.  Skin for abnormal spots. Counseling Your health care provider may ask you questions about your:  Alcohol, tobacco, and drug use.  Emotional well-being.  Home and relationship well-being.  Sexual activity.  Eating habits.  Work and work environment.  Method of birth control.  Menstrual cycle.  Pregnancy history. What immunizations do I need?  Influenza (flu) vaccine  This is recommended every year. Tetanus, diphtheria, and pertussis (Tdap) vaccine  You may need a Td booster every 10 years. Varicella (chickenpox) vaccine  You may need this if you have not been vaccinated. Human papillomavirus (HPV) vaccine  If recommended by your health care provider, you may need three doses over 6 months. Measles, mumps, and rubella (MMR) vaccine  You may need at least one dose of MMR. You may also need a second dose. Meningococcal conjugate (MenACWY) vaccine  One dose is recommended if you are age 19-21 years and a first-year college student living in a residence hall, or if you have one of several medical conditions. You may also need additional booster doses. Pneumococcal conjugate (PCV13) vaccine  You may need  this if you have certain conditions and were not previously vaccinated. Pneumococcal polysaccharide (PPSV23) vaccine  You may need one or two doses if you smoke cigarettes or if you have certain conditions. Hepatitis A vaccine  You may need this if you have certain conditions or if you travel or work in places where you may be exposed to hepatitis A. Hepatitis B vaccine  You may need this if you have certain conditions or if you travel or work in places where you may be exposed to hepatitis B. Haemophilus influenzae type b (Hib) vaccine  You may need this if you have certain conditions. You may receive vaccines as individual doses or as more than one vaccine together in one shot (combination vaccines). Talk with your health care provider about the risks and benefits of combination vaccines. What tests do I need?  Blood tests  Lipid and cholesterol levels. These may be checked every 5 years starting at age 20.  Hepatitis C test.  Hepatitis B test. Screening  Diabetes screening. This is done by checking your blood sugar (glucose) after you have not eaten for a while (fasting).  Sexually transmitted disease (STD) testing.  BRCA-related cancer screening. This may be done if you have a family history of breast, ovarian, tubal, or peritoneal cancers.  Pelvic exam and Pap test. This may be done every 3 years starting at age 21. Starting at age 30, this may be done every 5 years if you have a Pap test in combination with an HPV test. Talk with your health care provider about your test results, treatment options, and if necessary, the need for more tests.   Follow these instructions at home: Eating and drinking   Eat a diet that includes fresh fruits and vegetables, whole grains, lean protein, and low-fat dairy.  Take vitamin and mineral supplements as recommended by your health care provider.  Do not drink alcohol if: ? Your health care provider tells you not to drink. ? You are  pregnant, may be pregnant, or are planning to become pregnant.  If you drink alcohol: ? Limit how much you have to 0-1 drink a day. ? Be aware of how much alcohol is in your drink. In the [place] one drink equals one 12 oz bottle of beer (355 mL), one 5 oz glass of wine (148 mL), or one 1 oz glass of hard liquor (44 mL). Lifestyle  Take daily care of your teeth and gums.  Stay active. Exercise for at least 30 minutes on 5 or more days each week.  Do not use any products that contain nicotine or tobacco, such as cigarettes, e-cigarettes, and chewing tobacco. If you need help quitting, ask your health care provider.  If you are sexually active, practice safe sex. Use a condom or other form of birth control (contraception) in order to prevent pregnancy and STIs (sexually transmitted infections). If you plan to become pregnant, see your health care provider for a preconception visit. What's next?  Visit your health care provider once a year for a well check visit.  Ask your health care provider how often you should have your eyes and teeth checked.  Stay up to date on all vaccines. This information is not intended to replace advice given to you by your health care provider. Make sure you discuss any questions you have with your health care provider. Document Revised: 12/18/2017 Document Reviewed: 12/18/2017 Elsevier Patient Education  2020 [employer].

## 2019-09-03 NOTE — Assessment & Plan Note (Addendum)
Asymptomatic.  Will check CBC 

## 2019-09-03 NOTE — Assessment & Plan Note (Signed)
Discussed importance of healthy weight management Discussed diet and exercise Screening labs today 

## 2019-09-04 LAB — CBC WITH DIFFERENTIAL/PLATELET
Basophils Absolute: 0.1 10*3/uL (ref 0.0–0.2)
Basos: 1 %
EOS (ABSOLUTE): 0.3 10*3/uL (ref 0.0–0.4)
Eos: 4 %
Hematocrit: 33.5 % — ABNORMAL LOW (ref 34.0–46.6)
Hemoglobin: 10.4 g/dL — ABNORMAL LOW (ref 11.1–15.9)
Immature Grans (Abs): 0 10*3/uL (ref 0.0–0.1)
Immature Granulocytes: 0 %
Lymphocytes Absolute: 2.7 10*3/uL (ref 0.7–3.1)
Lymphs: 42 %
MCH: 22.7 pg — ABNORMAL LOW (ref 26.6–33.0)
MCHC: 31 g/dL — ABNORMAL LOW (ref 31.5–35.7)
MCV: 73 fL — ABNORMAL LOW (ref 79–97)
Monocytes Absolute: 0.4 10*3/uL (ref 0.1–0.9)
Monocytes: 6 %
Neutrophils Absolute: 3 10*3/uL (ref 1.4–7.0)
Neutrophils: 47 %
Platelets: 351 10*3/uL (ref 150–450)
RBC: 4.59 x10E6/uL (ref 3.77–5.28)
RDW: 14.4 % (ref 11.7–15.4)
WBC: 6.4 10*3/uL (ref 3.4–10.8)

## 2019-09-04 LAB — LIPID PANEL
Chol/HDL Ratio: 4.5 ratio — ABNORMAL HIGH (ref 0.0–4.4)
Cholesterol, Total: 236 mg/dL — ABNORMAL HIGH (ref 100–199)
HDL: 52 mg/dL (ref >39)
LDL Chol Calc (NIH): 168 mg/dL — ABNORMAL HIGH (ref 0–99)
Triglycerides: 93 mg/dL (ref 0–149)
VLDL Cholesterol Cal: 16 mg/dL (ref 5–40)

## 2019-09-04 LAB — HIV ANTIBODY (ROUTINE TESTING W REFLEX): HIV Screen 4th Generation wRfx: NONREACTIVE

## 2019-09-04 LAB — COMPREHENSIVE METABOLIC PANEL
ALT: 23 IU/L (ref 0–32)
AST: 24 IU/L (ref 0–40)
Albumin/Globulin Ratio: 1.4 (ref 1.2–2.2)
Albumin: 4.5 g/dL (ref 3.8–4.8)
Alkaline Phosphatase: 45 IU/L (ref 39–117)
BUN/Creatinine Ratio: 14 (ref 9–23)
BUN: 12 mg/dL (ref 6–20)
Bilirubin Total: 0.3 mg/dL (ref 0.0–1.2)
CO2: 18 mmol/L — ABNORMAL LOW (ref 20–29)
Calcium: 9.6 mg/dL (ref 8.7–10.2)
Chloride: 107 mmol/L — ABNORMAL HIGH (ref 96–106)
Creatinine, Ser: 0.83 mg/dL (ref 0.57–1.00)
GFR calc Af Amer: 109 mL/min/{1.73_m2} (ref >59)
GFR calc non Af Amer: 94 mL/min/{1.73_m2} (ref >59)
Globulin, Total: 3.2 g/dL (ref 1.5–4.5)
Glucose: 92 mg/dL (ref 65–99)
Potassium: 3.7 mmol/L (ref 3.5–5.2)
Sodium: 140 mmol/L (ref 134–144)
Total Protein: 7.7 g/dL (ref 6.0–8.5)

## 2019-09-07 ENCOUNTER — Telehealth: Payer: Self-pay

## 2019-09-07 NOTE — Telephone Encounter (Signed)
LMTCB, PEC may give result.  

## 2019-09-07 NOTE — Telephone Encounter (Signed)
Result viewed in MyChart on 09/07/19 at 11:34 am.

## 2019-09-07 NOTE — Telephone Encounter (Signed)
-----   Message from Virginia Crews, MD sent at 09/07/2019 10:37 AM EDT ----- Stable mild anemia related to thalassemia. Normal kidney function, liver function, electrolytes.  Negative HIV screening.  Cholesterol is high, but not in need of medication to lower it yet.  I recommend diet low in saturated fat and regular exercise - 30 min at least 5 times per week.  Will monitor annually.

## 2019-10-13 ENCOUNTER — Ambulatory Visit: Payer: Self-pay | Admitting: Family Medicine

## 2020-11-28 ENCOUNTER — Encounter: Payer: Self-pay | Admitting: Family Medicine

## 2021-01-01 ENCOUNTER — Telehealth: Payer: Self-pay | Admitting: Family Medicine

## 2021-02-06 ENCOUNTER — Encounter: Payer: Self-pay | Admitting: Obstetrics and Gynecology

## 2021-03-06 ENCOUNTER — Other Ambulatory Visit: Payer: Self-pay | Admitting: Obstetrics and Gynecology

## 2021-03-06 DIAGNOSIS — N6452 Nipple discharge: Secondary | ICD-10-CM

## 2021-03-06 DIAGNOSIS — N6311 Unspecified lump in the right breast, upper outer quadrant: Secondary | ICD-10-CM

## 2021-03-07 ENCOUNTER — Ambulatory Visit
Admission: RE | Admit: 2021-03-07 | Discharge: 2021-03-07 | Disposition: A | Discharge status: Home / Self Care | Payer: BC Managed Care – PPO | Source: Ambulatory Visit

## 2021-03-07 ENCOUNTER — Other Ambulatory Visit: Payer: Self-pay | Admitting: Obstetrics and Gynecology

## 2021-03-07 ENCOUNTER — Other Ambulatory Visit: Payer: Self-pay

## 2021-03-07 ENCOUNTER — Ambulatory Visit
Admission: RE | Admit: 2021-03-07 | Discharge: 2021-03-07 | Disposition: A | Discharge status: Home / Self Care | Payer: BC Managed Care – PPO | Source: Ambulatory Visit | Attending: Obstetrics and Gynecology | Admitting: Obstetrics and Gynecology

## 2021-03-07 DIAGNOSIS — N6452 Nipple discharge: Secondary | ICD-10-CM | POA: Diagnosis present

## 2021-03-07 DIAGNOSIS — R590 Localized enlarged lymph nodes: Secondary | ICD-10-CM

## 2021-03-07 DIAGNOSIS — N6311 Unspecified lump in the right breast, upper outer quadrant: Secondary | ICD-10-CM | POA: Diagnosis present

## 2021-03-07 DIAGNOSIS — D0511 Intraductal carcinoma in situ of right breast: Secondary | ICD-10-CM | POA: Diagnosis not present

## 2021-03-14 ENCOUNTER — Other Ambulatory Visit: Payer: Self-pay | Admitting: Pathology

## 2021-03-14 DIAGNOSIS — C50911 Malignant neoplasm of unspecified site of right female breast: Secondary | ICD-10-CM | POA: Insufficient documentation

## 2021-03-14 LAB — SURGICAL PATHOLOGY

## 2021-03-23 ENCOUNTER — Ambulatory Visit: Payer: BC Managed Care – PPO

## 2021-03-26 DIAGNOSIS — C50411 Malignant neoplasm of upper-outer quadrant of right female breast: Secondary | ICD-10-CM | POA: Insufficient documentation

## 2021-10-20 HISTORY — PX: MASTECTOMY: SHX3

## 2021-10-25 DIAGNOSIS — R911 Solitary pulmonary nodule: Secondary | ICD-10-CM | POA: Insufficient documentation

## 2021-12-22 ENCOUNTER — Emergency Department
Admission: EM | Admit: 2021-12-22 | Discharge: 2021-12-23 | Disposition: A | Discharge status: Home / Self Care | Payer: BC Managed Care – PPO | Attending: Emergency Medicine | Admitting: Emergency Medicine

## 2021-12-22 ENCOUNTER — Encounter: Payer: Self-pay | Admitting: Emergency Medicine

## 2021-12-22 ENCOUNTER — Other Ambulatory Visit: Payer: Self-pay

## 2021-12-22 DIAGNOSIS — Z853 Personal history of malignant neoplasm of breast: Secondary | ICD-10-CM | POA: Insufficient documentation

## 2021-12-22 DIAGNOSIS — R6884 Jaw pain: Secondary | ICD-10-CM | POA: Insufficient documentation

## 2021-12-22 DIAGNOSIS — K1379 Other lesions of oral mucosa: Secondary | ICD-10-CM

## 2021-12-22 LAB — COMPREHENSIVE METABOLIC PANEL
ALT: 18 U/L (ref 0–44)
AST: 21 U/L (ref 15–41)
Albumin: 4.5 g/dL (ref 3.5–5.0)
Alkaline Phosphatase: 47 U/L (ref 38–126)
Anion gap: 9 (ref 5–15)
BUN: 17 mg/dL (ref 6–20)
CO2: 23 mmol/L (ref 22–32)
Calcium: 10 mg/dL (ref 8.9–10.3)
Chloride: 109 mmol/L (ref 98–111)
Creatinine, Ser: 0.87 mg/dL (ref 0.44–1.00)
GFR, Estimated: >60 mL/min (ref >60)
Glucose, Bld: 92 mg/dL (ref 70–99)
Potassium: 3.4 mmol/L — ABNORMAL LOW (ref 3.5–5.1)
Sodium: 141 mmol/L (ref 135–145)
Total Bilirubin: 0.4 mg/dL (ref 0.3–1.2)
Total Protein: 8.6 g/dL — ABNORMAL HIGH (ref 6.5–8.1)

## 2021-12-22 LAB — CBC WITH DIFFERENTIAL/PLATELET
Abs Immature Granulocytes: 0.01 10*3/uL (ref 0.00–0.07)
Basophils Absolute: 0 10*3/uL (ref 0.0–0.1)
Basophils Relative: 0 %
Eosinophils Absolute: 0.8 10*3/uL — ABNORMAL HIGH (ref 0.0–0.5)
Eosinophils Relative: 15 %
HCT: 33 % — ABNORMAL LOW (ref 36.0–46.0)
Hemoglobin: 10 g/dL — ABNORMAL LOW (ref 12.0–15.0)
Immature Granulocytes: 0 %
Lymphocytes Relative: 29 %
Lymphs Abs: 1.5 10*3/uL (ref 0.7–4.0)
MCH: 22.5 pg — ABNORMAL LOW (ref 26.0–34.0)
MCHC: 30.3 g/dL (ref 30.0–36.0)
MCV: 74.3 fL — ABNORMAL LOW (ref 80.0–100.0)
Monocytes Absolute: 0.4 10*3/uL (ref 0.1–1.0)
Monocytes Relative: 7 %
Neutro Abs: 2.5 10*3/uL (ref 1.7–7.7)
Neutrophils Relative %: 49 %
Platelets: 383 10*3/uL (ref 150–400)
RBC: 4.44 MIL/uL (ref 3.87–5.11)
RDW: 12.4 % (ref 11.5–15.5)
WBC: 5.2 10*3/uL (ref 4.0–10.5)
nRBC: 0 % (ref 0.0–0.2)

## 2021-12-22 LAB — MAGNESIUM: Magnesium: 1.7 mg/dL (ref 1.7–2.4)

## 2021-12-22 MED ORDER — ONDANSETRON 4 MG PO TBDP
4.0000 mg | ORAL_TABLET | Freq: Once | ORAL | Status: AC
  Administered 2021-12-22: 4 mg via ORAL
  Filled 2021-12-22: qty 1

## 2021-12-22 MED ORDER — OXYCODONE-ACETAMINOPHEN 5-325 MG PO TABS
2.0000 | ORAL_TABLET | Freq: Once | ORAL | Status: AC
  Administered 2021-12-22: 2 via ORAL
  Filled 2021-12-22: qty 2

## 2021-12-22 MED ORDER — LIDOCAINE VISCOUS HCL 2 % MT SOLN
15.0000 mL | Freq: Once | OROMUCOSAL | Status: AC
  Administered 2021-12-22: 15 mL via OROMUCOSAL
  Filled 2021-12-22: qty 15

## 2021-12-22 NOTE — ED Provider Notes (Signed)
[facility] Provider Note    Event Date/Time   First MD Initiated Contact with Patient 12/22/21 2317     (approximate)   History   Oral Pain   HPI  Angel Pacheco is a 31 y.o. female with history of breast cancer on Keytruda who presents to the emergency department with mouth pain for the past few days.  She has had this before when she was on Carbo/Taxol but states it resolved without intervention.  She states she is already doing saline mouth rinses, sensitive toothpaste.  She states she has been taking ibuprofen at home but does not have anything stronger for pain.  She states she contacted the on-call nurse for [facility] who recommended she come to the emergency department.  She states it is painful to chew on that side of her mouth but she is able to eat and drink normally.  No fevers, vomiting.   History provided by patient and husband.    Past Medical History:  Diagnosis Date   Allergic rhinitis    Anemia    Raynaud phenomenon    Thalassemia     Past Surgical History:  Procedure Laterality Date   NO PAST SURGERIES      MEDICATIONS:  Prior to Admission medications   Medication Sig Start Date End Date Taking? Authorizing Provider  etonogestrel (NEXPLANON) 68 MG IMPL implant 1 each by Subdermal route once.    [provider]    Physical Exam   Triage Vital Signs: ED Triage Vitals [12/22/21 2119]  Enc Vitals Group     BP (!) 143/107     Pulse Rate 94     Resp 18     Temp 98.4 F (36.9 C)     Temp Source Oral     SpO2 100 %     Weight 135 lb (61.2 kg)     Height '5\' 1"'$  (1.549 m)     Head Circumference      Peak Flow      Pain Score 10     Pain Loc      Pain Edu?      Excl. in [place]?     Most recent vital signs: Vitals:   12/23/21 0000 12/23/21 0015  BP: 126/87   Pulse: (!) 104 96  Resp:    Temp:    SpO2: 100% 100%    CONSTITUTIONAL: Alert and oriented and responds appropriately to questions. Well-appearing;  well-nourished HEAD: Normocephalic, atraumatic EYES: Conjunctivae clear, pupils appear equal, sclera nonicteric ENT: normal nose; moist mucous membranes, mucosa appears normal within the mouth without any inflammation, ulceration or lesions present.  Most of the tenderness is along the gumline of the right lower teeth.  There is no swelling, increased redness, obvious abscess, drainage.  No appreciable dental caries.  She does not seem to be tender over her teeth.  No tonsillar hypertrophy or exudate.  No uvular deviation.  No trismus, drooling.  Normal phonation.  Posterior oropharynx is patent. NECK: Supple, normal ROM CARD: RRR; S1 and S2 appreciated; no murmurs, no clicks, no rubs, no gallops RESP: Normal chest excursion without splinting or tachypnea; breath sounds clear and equal bilaterally; no wheezes, no rhonchi, no rales, no hypoxia or respiratory distress, speaking full sentences ABD/GI: Normal bowel sounds; non-distended; soft, non-tender, no rebound, no guarding, no peritoneal signs BACK: The back appears normal EXT: Normal ROM in all joints; no deformity noted, no edema; no cyanosis SKIN: Normal color for age and  race; warm; no rash on exposed skin NEURO: Moves all extremities equally, normal speech PSYCH: The patient's mood and manner are appropriate.   ED Results / Procedures / Treatments   LABS: (all labs ordered are listed, but only abnormal results are displayed) Labs Reviewed  CBC WITH DIFFERENTIAL/PLATELET - Abnormal; Notable for the following components:      Result Value   Hemoglobin 10.0 (*)    HCT 33.0 (*)    MCV 74.3 (*)    MCH 22.5 (*)    Eosinophils Absolute 0.8 (*)    All other components within normal limits  COMPREHENSIVE METABOLIC PANEL - Abnormal; Notable for the following components:   Potassium 3.4 (*)    Total Protein 8.6 (*)    All other components within normal limits  MAGNESIUM     EKG:  RADIOLOGY: My personal review and interpretation of  imaging:    I have personally reviewed all radiology reports.   No results found.   PROCEDURES:  Critical Care performed: No    Procedures    IMPRESSION / MDM / ASSESSMENT AND PLAN / ED COURSE  I reviewed the triage vital signs and the nursing notes.    Patient here with possible mucositis from [facility].    DIFFERENTIAL DIAGNOSIS (includes but not limited to):   Mucositis, dental pain, no obvious sign of dental abscess, no Ludwigs   Patient's presentation is most consistent with acute, uncomplicated illness.   PLAN: We will provide with Percocet and viscous lidocaine for pain control.  She is tachycardic here but I suspect this is due to pain.  Will reassess heart rate.  There is no obvious sign of dental abscess, pharyngitis, tonsillitis.  No Ludwig's.  Doubt deep space neck infection, PTA.  I suspect symptoms are secondary to developing mucositis from her [place] but she does not have any oral lesions at this time.  We discussed this could also be coming from her teeth and recommended close follow-up with her dentist.  I do not feel she needs antibiotics, steroids at this time.   MEDICATIONS GIVEN IN ED: Medications  oxyCODONE-acetaminophen (PERCOCET/ROXICET) 5-325 MG per tablet 2 tablet (2 tablets Oral Given 12/22/21 2349)  ondansetron (ZOFRAN-ODT) disintegrating tablet 4 mg (4 mg Oral Given 12/22/21 2350)  lidocaine (XYLOCAINE) 2 % viscous mouth solution 15 mL (15 mLs Mouth/Throat Given 12/22/21 2350)     ED COURSE: Patient reports feeling better.  Tolerating p.o.  Heart rate has improved.  Will discharge with lidocaine, Percocet and encouraged close outpatient oncology follow-up.   At this time, I do not feel there is any life-threatening condition present. I reviewed all nursing notes, vitals, pertinent previous records.  All lab and urine results, EKGs, imaging ordered have been independently reviewed and interpreted by myself.  I reviewed all available radiology  reports from any imaging ordered this visit.  Based on my assessment, I feel the patient is safe to be discharged home without further emergent workup and can continue workup as an outpatient as needed. Discussed all findings, treatment plan as well as usual and customary return precautions.  They verbalize understanding and are comfortable with this plan.  Outpatient follow-up has been provided as needed.  All questions have been answered.    CONSULTS: No admission at this time.  Patient appears nontoxic and is afebrile and well-hydrated.  Tolerating p.o. here.  Feels much better after pain medication.   OUTSIDE RECORDS REVIEWED: Reviewed multiple recent oncology notes from [place].  FINAL CLINICAL IMPRESSION(S) / ED DIAGNOSES   Final diagnoses:  Mouth pain     Rx / DC Orders   ED Discharge Orders          Ordered    lidocaine (XYLOCAINE) 2 % solution  Every 4 hours PRN        12/23/21 0031    oxyCODONE-acetaminophen (PERCOCET) 5-325 MG tablet  Every 4 hours PRN        12/23/21 0031    ondansetron (ZOFRAN-ODT) 4 MG disintegrating tablet  Every 6 hours PRN        12/23/21 0031             Note:  This document was prepared using Dragon voice recognition software and may include unintentional dictation errors.   Tyshika Baldridge, Delice Bison, DO 12/23/21 [phone]

## 2021-12-22 NOTE — ED Notes (Signed)
Pt brought to ED rm 9 at this time, this RN now assuming care. 

## 2021-12-22 NOTE — ED Notes (Signed)
ED Provider at bedside. 

## 2021-12-22 NOTE — ED Triage Notes (Signed)
  Patient comes in with mouth pain that has been going on for several days.  Patient on Keytruda and chemo for breast cancer.  Patient states her top and bottom gums on the right side of her mouth are throbbing and have nerve pain.  Patient called PCP nurse line and was told to come here to be checked out.  Pain 10/10, throbbing. Afebrile at home and triage.

## 2021-12-23 MED ORDER — LIDOCAINE VISCOUS HCL 2 % MT SOLN: 15.0000 mL | OROMUCOSAL | 1 refills | Status: DC | PRN

## 2021-12-23 MED ORDER — ONDANSETRON 4 MG PO TBDP: 4.0000 mg | ORAL_TABLET | Freq: Four times a day (QID) | ORAL | 0 refills | Status: DC | PRN

## 2021-12-23 MED ORDER — OXYCODONE-ACETAMINOPHEN 5-325 MG PO TABS: 1.0000 | ORAL_TABLET | ORAL | 0 refills | Status: AC | PRN

## 2021-12-23 NOTE — ED Notes (Signed)
Signing pad did not work, pt verbalized understanding of DC instructions. 

## 2021-12-23 NOTE — Discharge Instructions (Signed)
I suspect your symptoms are secondary to [facility] especially given you had similar symptoms with chemotherapy.  I recommend you continue your oral regimen as instructed by your oncologist.  If you develop open wounds to your mouth, difficulty eating/drinking, fever of 100.4 or higher, please return to the emergency department for further evaluation and treatment.  I also recommend that if your symptoms or not improving that you follow-up with your oncologist and may even need to follow-up with your dentist as well.

## 2021-12-31 ENCOUNTER — Other Ambulatory Visit: Payer: Self-pay

## 2021-12-31 ENCOUNTER — Emergency Department
Admission: EM | Admit: 2021-12-31 | Discharge: 2021-12-31 | Disposition: A | Discharge status: Home / Self Care | Payer: Self-pay | Attending: Emergency Medicine | Admitting: Emergency Medicine

## 2021-12-31 DIAGNOSIS — S21212A Laceration without foreign body of left back wall of thorax without penetration into thoracic cavity, initial encounter: Secondary | ICD-10-CM | POA: Insufficient documentation

## 2021-12-31 DIAGNOSIS — W540XXA Bitten by dog, initial encounter: Secondary | ICD-10-CM | POA: Insufficient documentation

## 2021-12-31 DIAGNOSIS — Y9301 Activity, walking, marching and hiking: Secondary | ICD-10-CM | POA: Insufficient documentation

## 2021-12-31 DIAGNOSIS — S70312A Abrasion, left thigh, initial encounter: Secondary | ICD-10-CM | POA: Insufficient documentation

## 2021-12-31 HISTORY — DX: Malignant neoplasm of unspecified site of unspecified female breast: C50.919

## 2021-12-31 MED ORDER — AMOXICILLIN-POT CLAVULANATE 875-125 MG PO TABS: 1.0000 | ORAL_TABLET | Freq: Two times a day (BID) | ORAL | 0 refills | Status: AC

## 2021-12-31 NOTE — Discharge Instructions (Addendum)
Take antibiotics as prescribed.  Keep the wound clean with soap and water, wash multiple times per day.  Please return if you develop surrounding redness, worsening pain, pus, or any other concerns.  It is a pleasure caring for you today.

## 2021-12-31 NOTE — ED Triage Notes (Signed)
Pt presents via POV c/o dog bite to back and back of leg. Bleeding controlled.

## 2021-12-31 NOTE — ED Provider Notes (Signed)
[facility] Provider Note    Event Date/Time   First MD Initiated Contact with Patient 12/31/21 1932     (approximate)   History   Animal Bite   HPI  Angel Pacheco is a 31 y.o. female who presents today for evaluation of dog bite to her back and the back of her leg.  Patient reports that she was walking when the neighbors dog which is a [place] came over and jumped on her.  She is unsure if the dog scratched her or bit her.  She reports that she has sustained a cut to her left upper back and her left posterior thigh.  She reports that she spoke with the neighbors, and the dog is fully up-to-date on vaccinations.  Animal control contacted.  Patient is concerned because she is undergoing radiation for breast cancer and knows that she is high risk for infection.  She reports that her tetanus is up-to-date.  Patient Active Problem List   Diagnosis Date Noted   Overweight (BMI 25.0-29.9) 09/03/2019   Thalassemia 09/03/2019   Annual physical exam 09/03/2019          Physical Exam   Triage Vital Signs: ED Triage Vitals  Enc Vitals Group     BP 12/31/21 1918 (!) 130/100     Pulse Rate 12/31/21 1917 (!) 123     Resp 12/31/21 1917 14     Temp 12/31/21 1917 98.9 F (37.2 C)     Temp Source 12/31/21 1917 Oral     SpO2 12/31/21 1917 97 %     Weight 12/31/21 1918 133 lb (60.3 kg)     Height 12/31/21 1918 '5\' 1"'$  (1.549 m)     Head Circumference --      Peak Flow --      Pain Score 12/31/21 1918 4     Pain Loc --      Pain Edu? --      Excl. in [place]? --     Most recent vital signs: Vitals:   12/31/21 1918 12/31/21 1955  BP: (!) 130/100 (!) 151/94  Pulse:  (!) 103  Resp:  18  Temp:    SpO2:  100%    Physical Exam Vitals and nursing note reviewed.  Constitutional:      General: Awake and alert. No acute distress.    Appearance: Normal appearance. The patient is normal weight.  HENT:     Head: Normocephalic and atraumatic.     Mouth:  Mucous membranes are moist.  Eyes:     General: PERRL. Normal EOMs        Right eye: No discharge.        Left eye: No discharge.     Conjunctiva/sclera: Conjunctivae normal.  Cardiovascular:     Rate and Rhythm: Normal rate and regular rhythm.     Pulses: Normal pulses.  Pulmonary:     Effort: Pulmonary effort is normal. No respiratory distress.  Abdominal:     Abdomen is soft. There is no abdominal tenderness.     MSKl: No swelling. Normal range of motion.     Cervical back: Normal range of motion and neck supple.  Skin:    General: Skin is warm and dry.     Capillary Refill: Capillary refill takes less than 2 seconds.     Findings: 1 cm superficial laceration noted to left upper back.  No bleeding.  Does not appear to be puncture wound.  Full wound able  to be visualized, including base, superficial.  There is a scratch that has broken the top layer of skin to her left posterior thigh.  No surrounding erythema.  No lymphangitis.  No active bleeding. Neurological:     Mental Status: The patient is awake and alert.      ED Results / Procedures / Treatments   Labs (all labs ordered are listed, but only abnormal results are displayed) Labs Reviewed - No data to display   EKG     RADIOLOGY     PROCEDURES:  Critical Care performed:   Procedures   MEDICATIONS ORDERED IN ED: Medications - No data to display   IMPRESSION / MDM / [place] / ED COURSE  I reviewed the triage vital signs and the nursing notes.   Differential diagnosis includes, but is not limited to, dog bite versus scratch.  Patient has 2 areas of her body that were affected, her left upper back and left posterior thigh.  These both appear to be superficial.  There is no active bleeding.  The base of the wound is able to be visualized, does not appear to be a puncture wound.  She is sure that the dog is up-to-date on its vaccinations and that she does not want rabies vaccination.  She is also  sure that her tetanus is up-to-date.  Wounds were cleaned and bandaged.  She was started on Augmentin.  We discussed return precautions and outpatient follow-up.  Patient and her significant other understand and agree with plan.  Discharged stable condition.   Patient's presentation is most consistent with acute, uncomplicated illness.    FINAL CLINICAL IMPRESSION(S) / ED DIAGNOSES   Final diagnoses:  Dog bite, initial encounter     Rx / DC Orders   ED Discharge Orders          Ordered    amoxicillin-clavulanate (AUGMENTIN) 875-125 MG tablet  2 times daily        12/31/21 1950             Note:  This document was prepared using Dragon voice recognition software and may include unintentional dictation errors.   Emeline Gins 12/31/21 2005    Vanessa Felton, MD 12/31/21 2128

## 2022-09-03 DIAGNOSIS — Z9011 Acquired absence of right breast and nipple: Secondary | ICD-10-CM | POA: Insufficient documentation

## 2023-04-22 IMAGING — US US  BREAST BX W/ LOC DEV 1ST LESION IMG BX SPEC US GUIDE*R*
1 series · 15 of 23 positions shown · non-contrast
Comparison: Previous exam(s).
COMPARISON: Previous exam(s).

Addendum:
CLINICAL DATA: Palpable RIGHT breast mass and enlarged axillary
lymph nodes

EXAM:
ULTRASOUND GUIDED RIGHT BREAST CORE NEEDLE BIOPSY x2

[Series 1: us breast bx w/ loc dev 1st lesion img bx spec us  · 0.07mm/px · 15 of 23 slices shown]
[im 1/23]
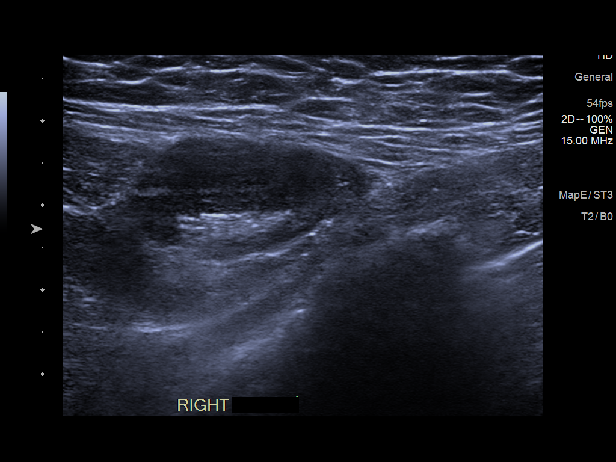
[im 3/23]
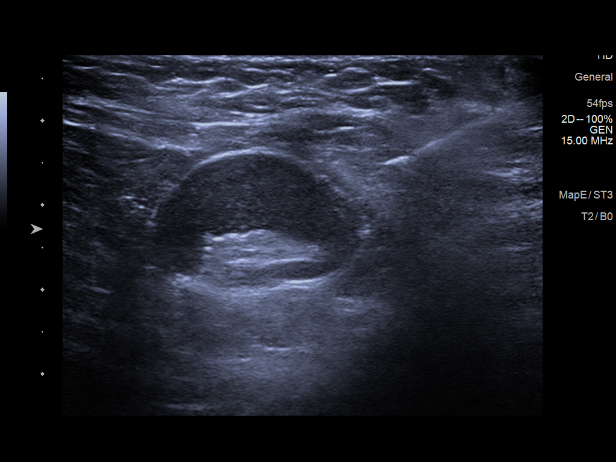
[im 4/23]
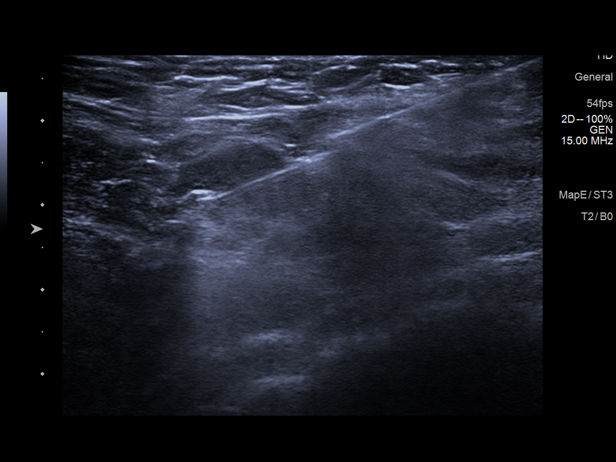
[im 6/23]
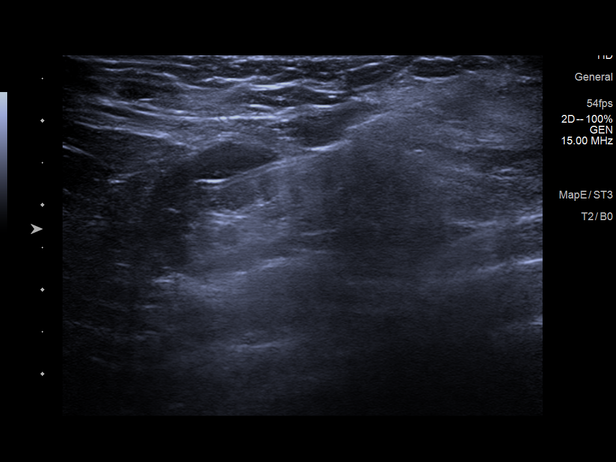
[im 7/23]
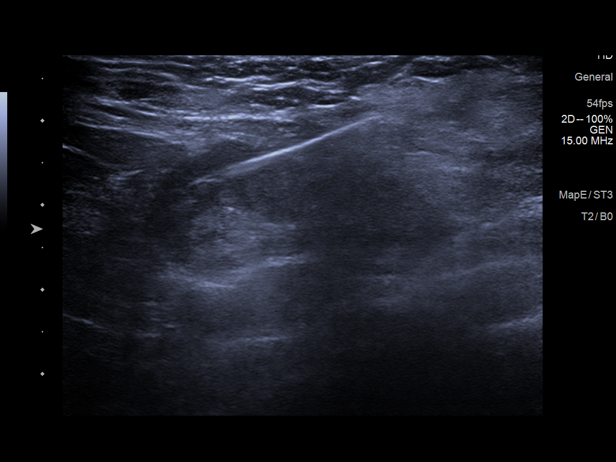
[im 9/23]
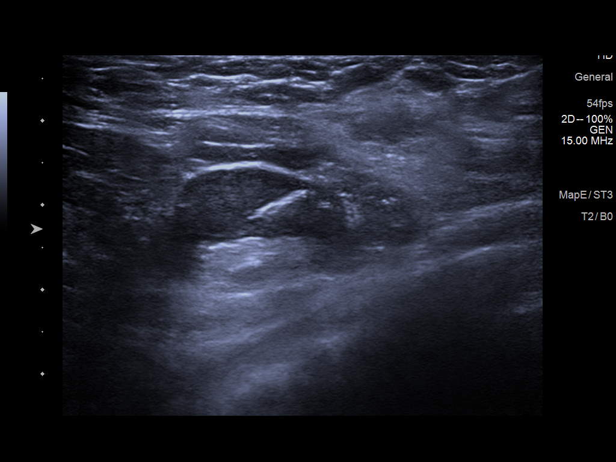
[im 10/23]
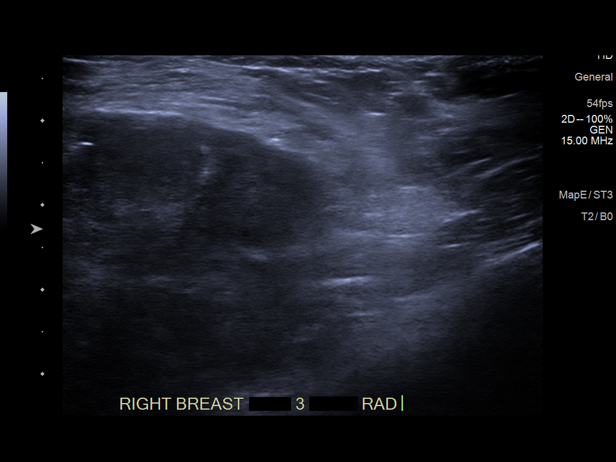
[im 12/23]
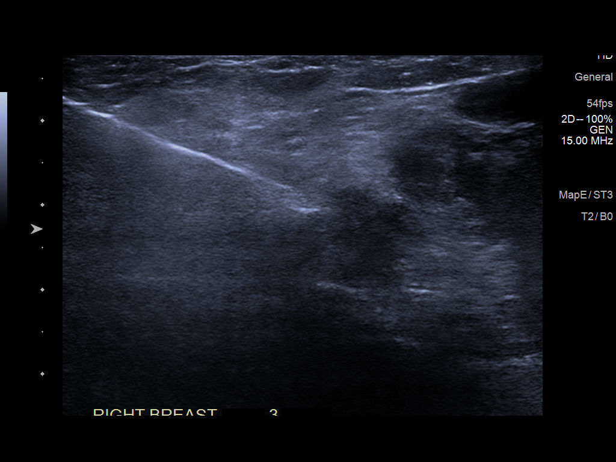
[im 14/23]
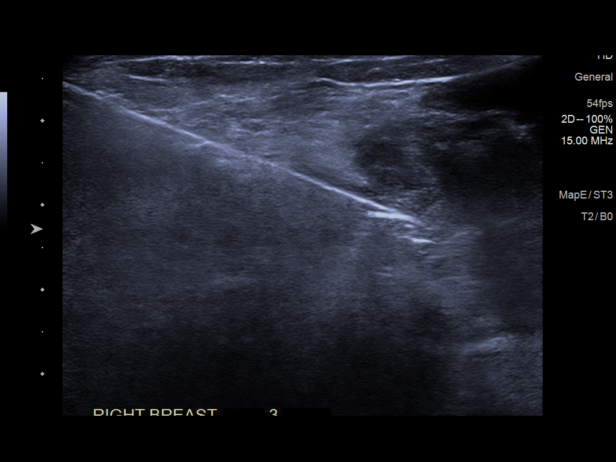
[im 15/23]
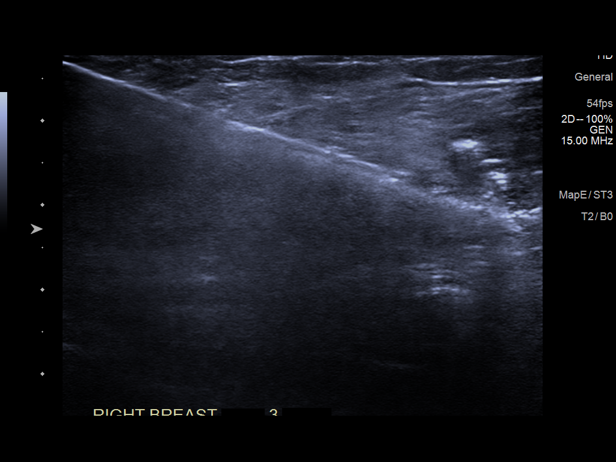
[im 17/23]
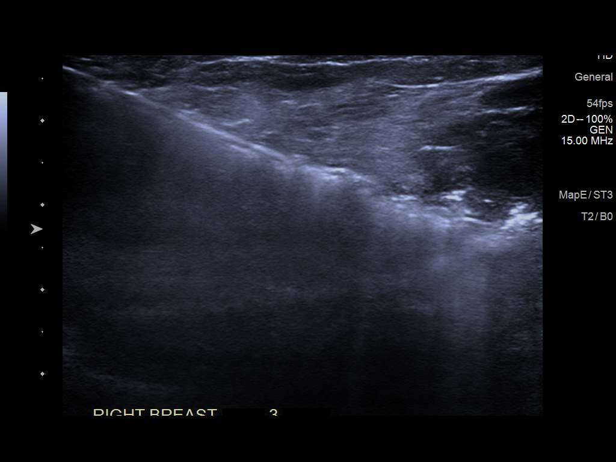
[im 18/23]
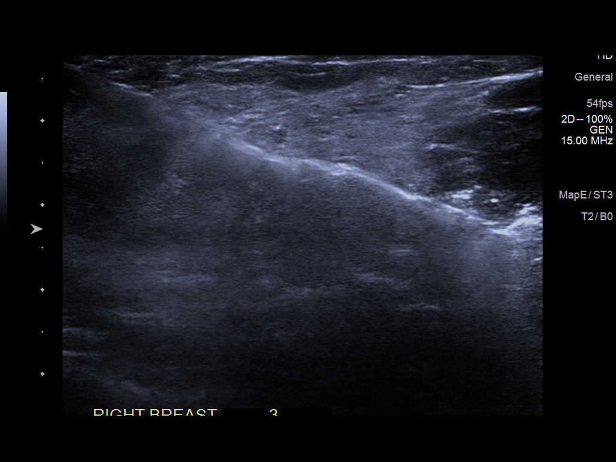
[im 20/23]
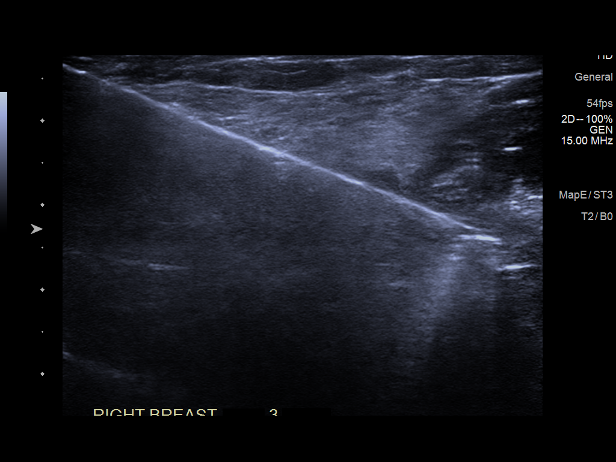
[im 21/23]
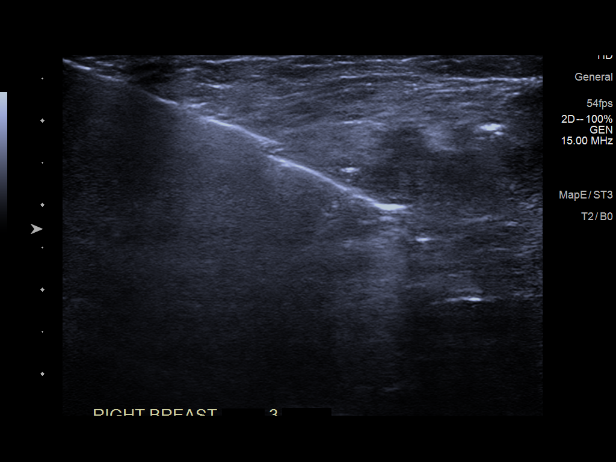
[im 23/23]
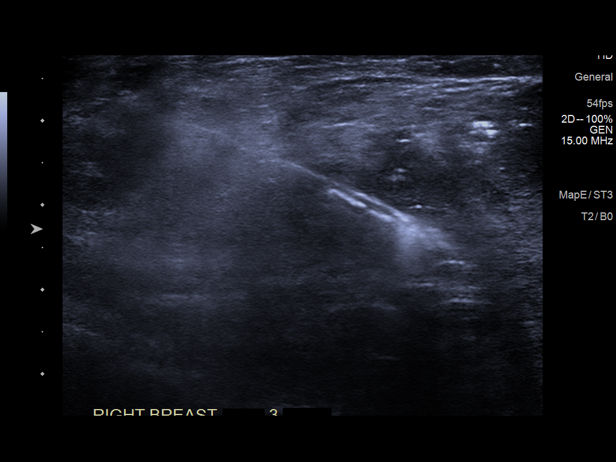

[15 of 23 positions shown; findings below may reference images not displayed]



Site 1: Enlarged RIGHT axillary lymph node

Lesion quadrant: RIGHT axilla.

Using sterile technique and 1% Lidocaine as local anesthetic, under
direct ultrasound visualization, a 14 gauge Valda device was
used to perform biopsy of an enlarged RIGHT axillary lymph node
using an inferior approach. At the conclusion of the procedure a
HYDROMARK tissue marker clip was deployed into the biopsy cavity.
Placement was verified sonographically due to gravid status.

Site 2: RIGHT breast mass; of note the breast mass encompasses
nearly the whole breast.

Lesion quadrant: Upper outer quadrant

Using sterile technique and 1% Lidocaine as local anesthetic, under
direct ultrasound visualization, a 14 gauge Valda device was
used to perform biopsy of a RIGHT breast mass using a lateral
approach. At the conclusion of the procedure a heart tissue marker
clip was deployed into the biopsy cavity. Placement was verified
sonographically due to gravid status.
IMPRESSION: 1. Ultrasound guided biopsy of a suspicious RIGHT breast mass. No
apparent complications.
2. Ultrasound guided biopsy of a RIGHT axillary lymph node. No
apparent complications.
3. Mammogram was deferred due to gravid status. Bilateral diagnostic
mammogram and breast MRI with and without contrast are recommended
with malignant results after patient discussion with obstetric
specialists.

ADDENDUM:
PATHOLOGY revealed: Site A. LYMPH NODE, RIGHT AXILLARY;
ULTRASOUND-GUIDED BIOPSY: - LYMPH NODE NEGATIVE FOR MALIGNANCY. -
DEEPER SECTIONS EXAMINED.

Pathology results are CONCORDANT with imaging findings, per Dr.
Carlos Alan Benegas.

PATHOLOGY revealed: Site B. BREAST, RIGHT [DATE] 3 CM FN;
ULTRASOUND-GUIDED BIOPSY: - INVASIVE MAMMARY CARCINOMA, NO SPECIAL
TYPE. - FOCAL ABSCESS FORMATION. Size of invasive carcinoma: 7 mm in
this sample. Grade 3. Ductal carcinoma in situ: Present, grade 2,
involving adenosis

Lymphovascular invasion: Not identified. Comment: The definitive
grade will be assigned on the excisional specimen.

Pathology results are CONCORDANT with imaging findings, per Dr.
Carlos Alan Benegas.

Pathology results and recommendations below were discussed with
patient by telephone on 03/08/2021. Patient reported biopsy site
within normal limits with slight tenderness at the site. Post biopsy
care instructions were reviewed, questions were answered and my
direct phone number was provided to patient. Patient was instructed
to call [HOSPITAL] if any concerns or questions arise
related to the biopsy.

Recommendations:

1. Recommend surgical, oncology, and fertility specialist
consultations. Recommend follow up with patient's OB/GYN provider. I
spoke with Aguuri Gogetter CMA (no RN available) at provider office,
who stated Dr. Mono No with patient on 03/08/2021, and
is coordinating all referrals for patient.

2. Recommend bilateral diagnostic mammogram and bilateral breast MRI
with and without contrast after patient discussion with obstetric
specialist.

3. Given overlying skin changes, there is concern for dermal
involvement vs inflammatory cancer. Recommend clinical management by
surgery/oncology team to determine need for skin punch biopsy.

4. Biopsy results indicated no metastatic disease within a sampled
RIGHT axillary lymph node. Given the pathologic finding of focal
abscess formation within the cancer, it is possible that some of the
lymph nodes are reactive. Recommend clinical management of the
enlarged right axillary lymph nodes. Repeat biopsy of a different
enlarged right axillary lymph node could be considered if clinically
indicated.

NOTE: Patient's Ob/Gyn (Dr. Danieke Jebbink) to arrange
consultations for patient. Patient being referred to [facility]
[REDACTED], [facility][[email or web address]].

Pathology results reported by Janishi Bayamon RN on 03/09/2021.



Site 1: Enlarged RIGHT axillary lymph node

Lesion quadrant: RIGHT axilla.

Using sterile technique and 1% Lidocaine as local anesthetic, under
direct ultrasound visualization, a 14 gauge Valda device was
used to perform biopsy of an enlarged RIGHT axillary lymph node
using an inferior approach. At the conclusion of the procedure a
HYDROMARK tissue marker clip was deployed into the biopsy cavity.
Placement was verified sonographically due to gravid status.

Site 2: RIGHT breast mass; of note the breast mass encompasses
nearly the whole breast.

Lesion quadrant: Upper outer quadrant

Using sterile technique and 1% Lidocaine as local anesthetic, under
direct ultrasound visualization, a 14 gauge Valda device was
used to perform biopsy of a RIGHT breast mass using a lateral
approach. At the conclusion of the procedure a heart tissue marker
clip was deployed into the biopsy cavity. Placement was verified
sonographically due to gravid status.
IMPRESSION: 1. Ultrasound guided biopsy of a suspicious RIGHT breast mass. No
apparent complications.
2. Ultrasound guided biopsy of a RIGHT axillary lymph node. No
apparent complications.
3. Mammogram was deferred due to gravid status. Bilateral diagnostic
mammogram and breast MRI with and without contrast are recommended
with malignant results after patient discussion with obstetric
specialists.

## 2023-04-23 NOTE — L&D Delivery Note (Signed)
 Delivery Note   Angel Pacheco is a 32 y.o. [id number] at [redacted]w[redacted]d Estimated Date of Delivery: 11/12/23  PRE-OPERATIVE DIAGNOSIS:  1) [redacted]w[redacted]d pregnancy.  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 4) PROM, onset of labor <24h after ROM  POST-OPERATIVE DIAGNOSIS:  1) [redacted]w[redacted]d pregnancy s/p Vaginal, Spontaneous Same, delivered  Delivery Type: Vaginal, Spontaneous   Delivery Anesthesia: Epidural  Labor Complications:  None    ESTIMATED BLOOD LOSS: 200 ml    FINDINGS:   Information for the patient's newborn:  Nekesha, Font [[id number]]  Live born female  Birth Weight:  pending per protocol APGAR: 8, 9  Newborn Delivery   Birth date/time: 11/15/2023 06:30:00 Delivery type: Vaginal, Spontaneous      SPECIMENS:   PLACENTA:   Appearance: Intact   Removal: Spontaneous     Disposition: discarded  Cord Blood: not collected, mother of baby B POS  DISPOSITION:  Infant left in stable condition in the delivery room, with L&D personnel and mother,  NARRATIVE SUMMARY: Labor course:  Angel Pacheco is a [id number] at [redacted]w[redacted]d who presented to Labor & Delivery for PROM. Her initial cervical exam was 3/80/-2. Labor proceeded with augmentation with IV pitocin  & AROM at 7cm. She began feeling increased rectal/vaginal pressure and was found to be 9cm, pressure increased and at 0550 anterior lip was easily reduced. Began pushing with lip reduced and she was found to be completely dilated at 0600. With excellent maternal pushing effort, she birthed a viable female infant at 3. Head birthed DOA, restituted to LOA. There was not a nuchal cord. The shoulders were birthed without difficulty. The infant was placed skin-to-skin with mother. The cord was doubly clamped and cut when pulsations ceased. The placenta delivered spontaneously and was noted to be intact with a 3VC. A perineal and vaginal examination was performed. Episiotomy/Lacerations: None Tolerated well. Mother and baby left in  stable condition.   Harlene LITTIE Cisco, CNM 11/15/2023 7:05 AM

## 2023-06-23 ENCOUNTER — Telehealth (INDEPENDENT_AMBULATORY_CARE_PROVIDER_SITE_OTHER): Payer: Self-pay

## 2023-06-23 DIAGNOSIS — Z3480 Encounter for supervision of other normal pregnancy, unspecified trimester: Secondary | ICD-10-CM | POA: Insufficient documentation

## 2023-06-23 DIAGNOSIS — O219 Vomiting of pregnancy, unspecified: Secondary | ICD-10-CM

## 2023-06-23 DIAGNOSIS — Z3A19 19 weeks gestation of pregnancy: Secondary | ICD-10-CM

## 2023-06-23 MED ORDER — ONDANSETRON HCL 4 MG PO TABS: 4.0000 mg | ORAL_TABLET | Freq: Three times a day (TID) | ORAL | 2 refills | Status: DC | PRN

## 2023-06-23 NOTE — Progress Notes (Signed)
 New OB Intake  I connected with  Angel Pacheco on 06/23/23 at  8:15 AM EST by MyChart Video Visit and verified that I am speaking with the correct person using two identifiers. Nurse is located at [facility] and pt is located at Work.  I discussed the limitations, risks, security and privacy concerns of performing an evaluation and management service by telephone and the availability of in person appointments. I also discussed with the patient that there may be a patient responsible charge related to this service. The patient expressed understanding and agreed to proceed.  I explained I am completing New OB Intake today. We discussed her EDD of 11/12/2023 that is based on LMP of 10/16/. Pt is G4/P3003. I reviewed her allergies, medications, Medical/Surgical/OB history, and appropriate screenings. There are no cats in the home.  Based on history, this is a/an pregnancy complicated by Thalassemia and Hx of breast cancer , Raynaud phenomenon.  She has no obstetrical history. She was induced 2 months early with her last baby because they wanted to start treatment for her breast cancer.   Patient Active Problem List   Diagnosis Date Noted   Overweight (BMI 25.0-29.9) 09/03/2019   Thalassemia 09/03/2019   Annual physical exam 09/03/2019    Concerns addressed today  None  Delivery Plans:  Plans to deliver at [facility]  Anatomy US Explained first scheduled [place] will be around 19 weeks.  Pt notified to arrive at 15 minutes early.  Labs Discussed genetic screening with patient. Patient consents genetic testing to be drawn at new OB visit. Discussed possible labs to be drawn at new OB appointment.  COVID Vaccine Patient has not had COVID vaccine.   Social Determinants of Health Food Insecurity: denies food insecurity WIC Referral: Patient is interested in referral to [facility].  Transportation: Patient denies transportation needs. Childcare: Discussed no children allowed at  ultrasound appointments.   First visit review I reviewed new OB appt with pt. I explained she will have blood work and pap smear/pelvic exam if indicated. Explained pt will be seen by Oley Balm, CMN at first visit; encounter routed to appropriate provider.     Tommie Raymond, [facility] 06/23/2023  8:38 AM

## 2023-06-23 NOTE — Patient Instructions (Signed)
 Breast Self-Awareness  Breast self-awareness is knowing how your breasts look and feel. You need to: Check your breasts on a regular basis. Tell your doctor about any changes. Become familiar with the look and feel of your breasts. This can help you catch a breast problem while it is still small and can be treated. You should do breast self-exams even if you have breast implants. What you need: A mirror. A well-lit room. A pillow or other soft object. How to do a breast self-exam Follow these steps to do a breast self-exam: Look for changes  Take off all the clothes above your waist. Stand in front of a mirror in a room with good lighting. Put your hands down at your sides. Compare your breasts in the mirror. Look for any difference between them, such as: A difference in shape. A difference in size. Wrinkles, dips, and bumps in one breast and not the other. Look at each breast for changes in the skin, such as: Redness. Scaly areas. Skin that has gotten thicker. Dimpling. Open sores (ulcers). Look for changes in your nipples, such as: Fluid coming out of a nipple. Fluid around a nipple. Bleeding. Dimpling. Redness. A nipple that looks pushed in (retracted), or that has changed position. Feel for changes Lie on your back. Feel each breast. To do this: Pick a breast to feel. Place a pillow under the shoulder closest to that breast. Put the arm closest to that breast behind your head. Feel the nipple area of that breast using the hand of your other arm. Feel the area with the pads of your three middle fingers by making small circles with your fingers. Use light, medium, and firm pressure. Continue the overlapping circles, moving downward over the breast. Keep making circles with your fingers. Stop when you feel your ribs. Start making circles with your fingers again, this time going upward until you reach your collarbone. Then, make circles outward across your breast and into  your armpit area. Squeeze your nipple. Check for discharge and lumps. Repeat these steps to check your other breast. Sit or stand in the tub or shower. With soapy water on your skin, feel each breast the same way you did when you were lying down. Write down what you find Writing down what you find can help you remember what to tell your doctor. Write down: What is normal for each breast. Any changes you find in each breast. These include: The kind of changes you find. A tender or painful breast. Any lump you find. Write down its size and where it is. When you last had your monthly period (menstrual cycle). General tips If you are breastfeeding, the best time to check your breasts is after you feed your baby or after you use a breast pump. If you get monthly bleeding, the best time to check your breasts is 5-7 days after your monthly cycle ends. With time, you will become comfortable with the self-exam. You will also start to know if there are changes in your breasts. Contact a doctor if: You see a change in the shape or size of your breasts or nipples. You see a change in the skin of your breast or nipples, such as red or scaly skin. You have fluid coming from your nipples that is not normal. You find a new lump or thick area. You have breast pain. You have any concerns about your breast health. Summary Breast self-awareness includes looking for changes in your breasts and feeling for  changes within your breasts. You should do breast self-awareness in front of a mirror in a well-lit room. If you get monthly periods (menstrual cycles), the best time to check your breasts is 5-7 days after your period ends. Tell your doctor about any changes you see in your breasts. Changes include changes in size, changes on the skin, painful or tender breasts, or fluid from your nipples that is not normal. This information is not intended to replace advice given to you by your health care provider. Make  sure you discuss any questions you have with your health care provider. Document Revised: 09/13/2021 Document Reviewed: 02/08/2021 Elsevier Patient Education  2024 [employer]   Morning Sickness  Morning sickness is when you throw up or feel like you may throw up during pregnancy. This condition often occurs in the morning, but it can also occur at any time of day. Morning sickness is most common during the first three months of pregnancy, but it can go on throughout the pregnancy. Morning sickness is usually harmless. But if you throw up all the time, you should see your health care provider. You may also hear this condition called nausea and vomiting of pregnancy. What are the causes? The cause of morning sickness is not known. It may be linked to changes in hormones during pregnancy. What increases the risk? You're more likely to have morning sickness if: You had morning sickness in another pregnancy. You're pregnant with more than one baby, such as twins. You had morning sickness in other pregnancies. You have had motion sickness before you were pregnant. You have had bad headaches or migraines before you were pregnant. What are the signs or symptoms? Symptoms of morning sickness include: Feeling like you may throw up. Throwing up. How is this diagnosed? Morning sickness is diagnosed based on your symptoms. How is this treated? Treatment is usually not needed for morning sickness. You may only need to change what you eat. In some cases, your provider may give you: Vitamin B6 supplements. Medicines to prevent throwing up. Ginger. Follow these instructions at home: Medicines Take your medicines only as told by your provider. Do not use any prescription, over-the-counter, or herbal medicines for morning sickness without first talking with your provider. Take prenatal vitamins. These can stop or lessen the symptoms of morning sickness. If you feel like you may throw up after  taking prenatal vitamins, take them at night or with a snack. Eating and drinking     Eat dry toast or crackers before getting out of bed. Eat 5 or 6 small meals a day. Try ginger ale made with real ginger, ginger tea, or ginger candies. Drink fluids throughout the day. Eat protein foods when you need a snack. Nuts, yogurt, and cheese are good choices. Eat dry and bland foods like rice or baked potatoes. Foods that are high in carbohydrates are often helpful. Have someone cook for you if the smell of food makes you want to throw up. Foods to avoid Greasy foods. Fatty foods. Spicy foods. General instructions Try to avoid smells that make you feel sick. Use an air purifier to keep the air in your house free of smells. Try using an acupressure wristband. This is a wristband that's used to treat motion sickness. Try acupuncture. In this treatment, a provider puts thin needles into certain areas of your body to make you feel better. Brush your teeth after throwing up or rinse with a mix of baking soda and water. The acid in  throw-up can hurt your teeth. Contact a health care provider if: Your symptoms do not get better. You feel dizzy or light-headed. You're losing weight. Get help right away if: The feeling that you may throw up will not go away, or you can't stop throwing up. You faint. You have very bad pain in your belly. This information is not intended to replace advice given to you by your health care provider. Make sure you discuss any questions you have with your health care provider. Document Revised: 01/09/2023 Document Reviewed: 07/18/2022 Elsevier Patient Education  2024 [employer].  First Trimester of Pregnancy  The first trimester of pregnancy starts on the first day of your last monthly period until the end of week 13. This is months 1 through 3 of pregnancy. A week after a sperm fertilizes an egg, the egg will implant into the wall of the uterus and begin to develop  into a baby. Body changes during your first trimester Your body goes through many changes during pregnancy. The changes usually return to normal after your baby is born. Physical changes Your breasts may grow larger and may hurt. The area around your nipples may get darker. Your periods will stop. Your hair and nails may grow faster. You may pee more often. Health changes You may tire easily. Your gums may bleed and may be sensitive when you brush and floss. You may not feel hungry. You may have heartburn. You may throw up or feel like you may throw up. You may want to eat some foods, but not others. You may have headaches. You may have trouble pooping (constipation). Other changes Your emotions may change from day to day. You may have more dreams. Follow these instructions at home: Medicines Talk to your health care provider if you're taking medicines. Ask if the medicines are safe to take during pregnancy. Your provider may change the medicines that you take. Do not take any medicines unless told to by your provider. Take a prenatal vitamin that has at least 600 micrograms (mcg) of folic acid. Do not use herbal medicines, illegal substances, or medicines that are not approved by your provider. Eating and drinking While you're pregnant your body needs extra food for your growing baby. Talk with your provider about what to eat while pregnant. Activity Most women are able to exercise during pregnancy. Exercises may need to change as your pregnancy goes on. Talk to your provider about your activities and exercise routines. Relieving pain and discomfort Wear a good, supportive bra if your breasts hurt. Rest with your legs raised if you have leg cramps or low back pain. Safety Wear your seatbelt at all times when you're in a car. Talk to your provider if someone hits you, hurts you, or yells at you. Talk with your provider if you're feeling sad or have thoughts of hurting  yourself. Lifestyle Certain things can be harmful while you're pregnant. Follow these rules: Do not use hot tubs, steam rooms, or saunas. Do not douche. Do not use tampons or scented pads. Do not drink alcohol,smoke, vape, or use products with nicotine or tobacco in them. If you need help quitting, talk with your provider. Avoid cat litter boxes and soil used by cats. These things carry germs that can cause harm to your pregnancy and your baby. General instructions Keep all follow-up visits. It helps you and your unborn baby stay as healthy as possible. Write down your questions. Take them to your visits. Your provider will: Talk with  you about your overall health. Give you advice or refer you to specialists who can help with different needs, including: Prenatal education classes. Mental health and counseling. Foods and healthy eating. Ask for help if you need help with food. Call your dentist and ask to be seen. Brush your teeth with a soft toothbrush. Floss gently. Where to find more information American Pregnancy Association: americanpregnancy.org [employer]: acog.org Office on [employer] Health: [employer] Contact a health care provider if: You feel dizzy, faint, or have a fever. You vomit or have watery poop (diarrhea) for 2 days or more. You have abnormal discharge or bleeding from your vagina. You have pain when you pee or your pee smells bad. You have cramps, pain, or pressure in your belly area. Get help right away if: You have trouble breathing or chest pain. You have any kind of injury, such as from a fall or a car crash. These symptoms may be an emergency. Get help right away. Call 911. Do not wait to see if the symptoms will go away. Do not drive yourself to the hospital. This information is not intended to replace advice given to you by your health care provider. Make sure you discuss any questions you have with your health care  provider. Document Revised: 01/09/2023 Document Reviewed: 08/09/2022 Elsevier Patient Education  2024 Elsevier Inc.  Commonly Asked Questions During Pregnancy  Cats: A parasite can be excreted in cat feces.  To avoid exposure you need to have another person empty the little box.  If you must empty the litter box you will need to wear gloves.  Wash your hands after handling your cat.  This parasite can also be found in raw or undercooked meat so this should also be avoided.  Colds, Sore Throats, Flu: Please check your medication sheet to see what you can take for symptoms.  If your symptoms are unrelieved by these medications please call the office.  Dental Work: Most any dental work Agricultural consultant recommends is permitted.  X-rays should only be taken during the first trimester if absolutely necessary.  Your abdomen should be shielded with a lead apron during all x-rays.  Please notify your provider prior to receiving any x-rays.  Novocaine is fine; gas is not recommended.  If your dentist requires a note from [place] prior to dental work please call the office and we will provide one for you.  Exercise: Exercise is an important part of staying healthy during your pregnancy.  You may continue most exercises you were accustomed to prior to pregnancy.  Later in your pregnancy you will most likely notice you have difficulty with activities requiring balance like riding a bicycle.  It is important that you listen to your body and avoid activities that put you at a higher risk of falling.  Adequate rest and staying well hydrated are a must!  If you have questions about the safety of specific activities ask your provider.    Exposure to Children with illness: Try to avoid obvious exposure; report any symptoms to [place] when noted,  If you have chicken pos, red measles or mumps, you should be immune to these diseases.   Please do not take any vaccines while pregnant unless you have checked with your OB  provider.  Fetal Movement: After 28 weeks we recommend you do "kick counts" twice daily.  Lie or sit down in a calm quiet environment and count your baby movements "kicks".  You should feel your baby  at least 10 times per hour.  If you have not felt 10 kicks within the first hour get up, walk around and have something sweet to eat or drink then repeat for an additional hour.  If count remains less than 10 per hour notify your provider.  Fumigating: Follow your pest control agent's advice as to how long to stay out of your home.  Ventilate the area well before re-entering.  Hemorrhoids:   Most over-the-counter preparations can be used during pregnancy.  Check your medication to see what is safe to use.  It is important to use a stool softener or fiber in your diet and to drink lots of liquids.  If hemorrhoids seem to be getting worse please call the office.   Hot Tubs:  Hot tubs Jacuzzis and saunas are not recommended while pregnant.  These increase your internal body temperature and should be avoided.  Intercourse:  Sexual intercourse is safe during pregnancy as long as you are comfortable, unless otherwise advised by your provider.  Spotting may occur after intercourse; report any bright red bleeding that is heavier than spotting.  Labor:  If you know that you are in labor, please go to the hospital.  If you are unsure, please call the office and let us help you decide what to do.  Lifting, straining, etc:  If your job requires heavy lifting or straining please check with your provider for any limitations.  Generally, you should not lift items heavier than that you can lift simply with your hands and arms (no back muscles)  Painting:  Paint fumes do not harm your pregnancy, but may make you ill and should be avoided if possible.  Latex or water based paints have less odor than oils.  Use adequate ventilation while painting.  Permanents & Hair Color:  Chemicals in hair dyes are not recommended as  they cause increase hair dryness which can increase hair loss during pregnancy.  " Highlighting" and permanents are allowed.  Dye may be absorbed differently and permanents may not hold as well during pregnancy.  Sunbathing:  Use a sunscreen, as skin burns easily during pregnancy.  Drink plenty of fluids; avoid over heating.  Tanning Beds:  Because their possible side effects are still unknown, tanning beds are not recommended.  Ultrasound Scans:  Routine ultrasounds are performed at approximately 20 weeks.  You will be able to see your baby's general anatomy an if you would like to know the gender this can usually be determined as well.  If it is questionable when you conceived you may also receive an ultrasound early in your pregnancy for dating purposes.  Otherwise ultrasound exams are not routinely performed unless there is a medical necessity.  Although you can request a scan we ask that you pay for it when conducted because insurance does not cover " patient request" scans.  Work: If your pregnancy proceeds without complications you may work until your due date, unless your physician or employer advises otherwise.  Round Ligament Pain/Pelvic Discomfort:  Sharp, shooting pains not associated with bleeding are fairly common, usually occurring in the second trimester of pregnancy.  They tend to be worse when standing up or when you remain standing for long periods of time.  These are the result of pressure of certain pelvic ligaments called "round ligaments".  Rest, Tylenol and heat seem to be the most effective relief.  As the womb and fetus grow, they rise out of the pelvis and the discomfort improves.  Please notify the office if your pain seems different than that described.  It may represent a more serious condition.    Common Medications Safe in Pregnancy  Acne:      Constipation:  Benzoyl Peroxide     Colace  Clindamycin      Dulcolax Suppository  Topica  Erythromycin     Fibercon  Salicylic Acid      Metamucil         Miralax AVOID:        Senakot   Accutane    Cough:  Retin-A       Cough Drops  Tetracycline      Phenergan w/ Codeine if Rx  Minocycline      Robitussin (Plain & DM)  Antibiotics:     Crabs/Lice:  Ceclor       RID  Cephalosporins    AVOID:  E-Mycins      Kwell  Keflex  Macrobid/Macrodantin   Diarrhea:  Penicillin      Kao-Pectate  Zithromax      Imodium AD         PUSH FLUIDS AVOID:       Cipro     Fever:  Tetracycline      Tylenol (Regular or Extra  Minocycline       Strength)  Levaquin      Extra Strength-Do not          Exceed 8 tabs/24 hrs Caffeine:        200mg /day (equiv. To 1 cup of coffee or  approx. 3 12 oz sodas)         Gas: Cold/Hayfever:       Gas-X  Benadryl      Mylicon  Claritin       Phazyme  **Claritin-D        Chlor-Trimeton    Headaches:  Dimetapp      ASA-Free Excedrin  Drixoral-Non-Drowsy     Cold Compress  Mucinex (Guaifenasin)     Tylenol (Regular or Extra  Sudafed/Sudafed-12 Hour     Strength)  **Sudafed PE Pseudoephedrine   Tylenol Cold & Sinus     Vicks Vapor Rub  Zyrtec  **AVOID if Problems With Blood Pressure         Heartburn: Avoid lying down for at least 1 hour after meals  Aciphex      Maalox     Rash:  Milk of Magnesia     Benadryl    Mylanta       1% Hydrocortisone Cream  Pepcid  Pepcid Complete   Sleep Aids:  Prevacid      Ambien   Prilosec       Benadryl  Rolaids       Chamomile Tea  Tums (Limit 4/day)     Unisom         Tylenol PM         Warm milk-add vanilla or  Hemorrhoids:       Sugar for taste  Anusol/Anusol H.C.  (RX: Analapram 2.5%)  Sugar Substitutes:  Hydrocortisone OTC     Ok in moderation  Preparation H      Tucks        Vaseline lotion applied to tissue with wiping    Herpes:     Throat:  Acyclovir      Oragel  Famvir  Valtrex     Vaccines:         Flu Shot Leg Cramps:       *  Gardasil  Benadryl      Hepatitis  A         Hepatitis B Nasal Spray:       Pneumovax  Saline Nasal Spray     Polio Booster         Tetanus Nausea:       Tuberculosis test or PPD  Vitamin B6 25 mg TID   AVOID:    Dramamine      *Gardasil  Emetrol       Live Poliovirus  Ginger Root 250 mg QID    MMR (measles, mumps &  High Complex Carbs @ Bedtime    rebella)  Sea Bands-Accupressure    Varicella (Chickenpox)  Unisom 1/2 tab TID     *No known complications           If received before Pain:         Known pregnancy;   Darvocet       Resume series after  Lortab        Delivery  Percocet    Yeast:   Tramadol      Femstat  Tylenol 3      Gyne-lotrimin  Ultram       Monistat  Vicodin           MISC:         All Sunscreens           Hair Coloring/highlights          Insect Repellant's          (Including DEET)         Mystic Tans

## 2023-06-30 ENCOUNTER — Other Ambulatory Visit: Payer: Self-pay | Admitting: Certified Nurse Midwife

## 2023-06-30 DIAGNOSIS — Z363 Encounter for antenatal screening for malformations: Secondary | ICD-10-CM

## 2023-06-30 DIAGNOSIS — O0932 Supervision of pregnancy with insufficient antenatal care, second trimester: Secondary | ICD-10-CM

## 2023-07-02 ENCOUNTER — Other Ambulatory Visit (HOSPITAL_COMMUNITY)
Admission: RE | Admit: 2023-07-02 | Discharge: 2023-07-02 | Disposition: A | Discharge status: Home / Self Care | Payer: PRIVATE HEALTH INSURANCE | Source: Ambulatory Visit | Attending: Certified Nurse Midwife | Admitting: Certified Nurse Midwife

## 2023-07-02 ENCOUNTER — Ambulatory Visit: Payer: Self-pay

## 2023-07-02 ENCOUNTER — Encounter: Payer: Self-pay | Admitting: Certified Nurse Midwife

## 2023-07-02 ENCOUNTER — Ambulatory Visit (INDEPENDENT_AMBULATORY_CARE_PROVIDER_SITE_OTHER): Payer: PRIVATE HEALTH INSURANCE | Admitting: Certified Nurse Midwife

## 2023-07-02 VITALS — BP 121/74 | HR 112 | Wt 145.4 lb

## 2023-07-02 DIAGNOSIS — Z363 Encounter for antenatal screening for malformations: Secondary | ICD-10-CM

## 2023-07-02 DIAGNOSIS — Z113 Encounter for screening for infections with a predominantly sexual mode of transmission: Secondary | ICD-10-CM | POA: Insufficient documentation

## 2023-07-02 DIAGNOSIS — Z853 Personal history of malignant neoplasm of breast: Secondary | ICD-10-CM | POA: Insufficient documentation

## 2023-07-02 DIAGNOSIS — Z3480 Encounter for supervision of other normal pregnancy, unspecified trimester: Secondary | ICD-10-CM

## 2023-07-02 DIAGNOSIS — Z3A21 21 weeks gestation of pregnancy: Secondary | ICD-10-CM | POA: Insufficient documentation

## 2023-07-02 DIAGNOSIS — Z3482 Encounter for supervision of other normal pregnancy, second trimester: Secondary | ICD-10-CM

## 2023-07-02 DIAGNOSIS — O0932 Supervision of pregnancy with insufficient antenatal care, second trimester: Secondary | ICD-10-CM

## 2023-07-02 NOTE — Progress Notes (Signed)
 NEW OB HISTORY AND PHYSICAL  SUBJECTIVE:       Angel Pacheco is a 33 y.o. [phone] female, Patient's last menstrual period was 02/05/2023 (exact date)., Estimated Date of Delivery: 11/12/23, [redacted]w[redacted]d, presents today for establishment of Prenatal Care. She reports no complaints   Obsteric History  OB History  Gravida Para Term Preterm AB Living  4 3 2 1  3   SAB IAB Ectopic Multiple Live Births      3    # Outcome Date GA Lbr Len/2nd Weight Sex Type Anes PTL Lv  4 Current           3 Preterm 07/05/21 [redacted]w[redacted]d   M Vag-Spont  N LIV  2 Term 08/13/11 [redacted]w[redacted]d   M Vag-Spont  N LIV  1 Term 05/18/09 [redacted]w[redacted]d   F Vag-Spont   LIV     Partner/Relationship: Denzel - Research scientist (physical sciences) Living situation: husband and kids Work: Glass blower/designer at [employer] Substance JYN:WGNF   Gynecologic History Pregnancy dating reviewed:  Patient's last menstrual period was 02/05/2023 (exact date). Normal Last Pap: 2022. Results were: normal  Other Pertinent Health History:   Past Medical History:  Diagnosis Date   Allergic rhinitis    Anemia    Breast cancer (HCC)    Raynaud phenomenon    Thalassemia      Current Outpatient Medications on File Prior to Visit  Medication Sig Dispense Refill   ondansetron (ZOFRAN) 4 MG tablet Take 1 tablet (4 mg total) by mouth every 8 (eight) hours as needed for nausea or vomiting. 20 tablet 2   No current facility-administered medications on file prior to visit.    No Known Allergies  Social History   Socioeconomic History   Marital status: Married    Spouse name: Denzel   Number of children: 3   Years of education: 13   Highest education level: Some college, no degree  Occupational History   Occupation: Audiological scientist  Tobacco Use   Smoking status: Never    Passive exposure: Current   Smokeless tobacco: Never  Vaping Use   Vaping status: Never Used  Substance and Sexual Activity   Alcohol use: No   Drug use: No   Sexual activity: Yes    Partners: Male     Birth control/protection: None  Other Topics Concern   Not on file  Social History Narrative   Not on file   Social Drivers of Health   Financial Resource Strain: Not on file  Food Insecurity: No Food Insecurity (06/23/2023)   Hunger Vital Sign    Worried About Running Out of Food in the Last Year: Never true    Ran Out of Food in the Last Year: Never true  Transportation Needs: No Transportation Needs (06/23/2023)   PRAPARE - Administrator, Civil Service (Medical): No    Lack of Transportation (Non-Medical): No  Physical Activity: Not on file  Stress: No Stress Concern Present (06/23/2023)   [employer] of Occupational Health - Occupational Stress Questionnaire    Feeling of Stress : Not at all  Social Connections: Not on file  Intimate Partner Violence: Not At Risk (06/23/2023)   Humiliation, Afraid, Rape, and Kick questionnaire    Fear of Current or Ex-Partner: No    Emotionally Abused: No    Physically Abused: No    Sexually Abused: No    Family History  Problem Relation Age of Onset   Diabetes Mother    Stroke Father    Gestational  diabetes Sister    Asthma Maternal Grandmother    Breast cancer Maternal Aunt        great aunts   Colon cancer Neg Hx     The following portions of the patient's history were reviewed and updated as appropriate: allergies, current medications, past OB history, past medical history, past surgical history, past family history, past social history, and problem list.  Constitutional: Denied constitutional symptoms, night sweats, recent illness, fatigue, fever, insomnia and weight loss.  Eyes: Denied eye symptoms, eye pain, photophobia, vision change and visual disturbance.  Ears/Nose/Throat/Neck: Denied ear, nose, throat or neck symptoms, hearing loss, nasal discharge, sinus congestion and sore throat.  Cardiovascular: Denied cardiovascular symptoms, arrhythmia, chest pain/pressure, edema, exercise intolerance, orthopnea and  palpitations.  Respiratory: Denied pulmonary symptoms, asthma, pleuritic pain, productive sputum, cough, dyspnea and wheezing.  Gastrointestinal: Denied, gastro-esophageal reflux, melena, nausea and vomiting.  Genitourinary: Denied genitourinary symptoms including symptomatic vaginal discharge, pelvic relaxation issues, and urinary complaints.  Musculoskeletal: Denied musculoskeletal symptoms, stiffness, swelling, muscle weakness and myalgia.  Dermatologic: Denied dermatology symptoms, rash and scar.  Neurologic: Denied neurology symptoms, dizziness, headache, neck pain and syncope.  Psychiatric: Denied psychiatric symptoms, anxiety and depression.  Endocrine: Denied endocrine symptoms including hot flashes and night sweats.     OBJECTIVE: Initial Physical Exam (New OB)  GENERAL APPEARANCE: alert, well appearing HEAD: normocephalic, atraumatic THYROID: no thyromegaly or masses present BREASTS: no masses noted, no significant tenderness, no palpable axillary nodes, no skin changes, not examined LUNGS: clear to auscultation, no wheezes, rales or rhonchi, symmetric air entry HEART: regular rate and rhythm, no murmurs ABDOMEN: soft, nontender, nondistended, no abnormal masses, no epigastric pain EXTREMITIES: no redness or tenderness in the calves or thighs SKIN: normal coloration and turgor, no rashes LYMPH NODES: no adenopathy palpable NEUROLOGIC: alert, oriented, normal speech, no focal findings or movement disorder noted  PELVIC EXAM deferred  ASSESSMENT/PLAN  Normal pregnancy  Patient does not meet criteria for low dose ASA therapy.    History of breast cancer Malignant neoplasm of left female breast, Er negative / PR low / her2 negative Diagnosed 05/2021 during last pregnancy.  S/p mastectectomy, chemo 12/2022 - CT showed no metastatic disease. Next follow up in June  Preterm delivery Induced at 32 weeks to begin breast cancer treatment. Previous pregnancies full term     Routine prenatal care. We discussed an overview of prenatal care and when to call. Reviewed diet, exercise, and weight gain recommendations in pregnancy. Discussed benefits of breastfeeding and lactation resources at [facility]. I reviewed labs and answered all questions.  Anatomy ultrasound completed NOB labs: collected  See orders  Oley Balm, CNM 07/02/23 4:42 PM

## 2023-07-02 NOTE — Assessment & Plan Note (Signed)
 Induced at 32 weeks to begin breast cancer treatment. Previous pregnancies full term

## 2023-07-02 NOTE — Assessment & Plan Note (Signed)
 Malignant neoplasm of left female breast, Er negative / PR low / her2 negative Diagnosed 05/2021 during last pregnancy.  S/p mastectectomy, chemo 12/2022 - CT showed no metastatic disease. Next follow up in June

## 2023-07-03 LAB — URINALYSIS, ROUTINE W REFLEX MICROSCOPIC
Bilirubin, UA: NEGATIVE
Glucose, UA: NEGATIVE
Ketones, UA: NEGATIVE
Nitrite, UA: NEGATIVE
Protein,UA: NEGATIVE
RBC, UA: NEGATIVE
Specific Gravity, UA: 1.013 (ref 1.005–1.030)
Urobilinogen, Ur: 0.2 mg/dL (ref 0.2–1.0)
pH, UA: 7 (ref 5.0–7.5)

## 2023-07-03 LAB — PROTEIN / CREATININE RATIO, URINE
Creatinine, Urine: 46.7 mg/dL
Protein, Ur: 5.2 mg/dL
Protein/Creat Ratio: 111 mg/g{creat} (ref 0–200)

## 2023-07-03 LAB — MICROSCOPIC EXAMINATION
Bacteria, UA: NONE SEEN
Casts: NONE SEEN /LPF
RBC, Urine: NONE SEEN /HPF (ref 0–2)
WBC, UA: NONE SEEN /HPF (ref 0–5)

## 2023-07-04 LAB — CERVICOVAGINAL ANCILLARY ONLY
Bacterial Vaginitis (gardnerella): NEGATIVE
Candida Glabrata: NEGATIVE
Candida Vaginitis: POSITIVE — AB
Chlamydia: NEGATIVE
Comment: NEGATIVE
Comment: NEGATIVE
Comment: NEGATIVE
Comment: NEGATIVE
Comment: NEGATIVE
Comment: NORMAL
Neisseria Gonorrhea: NEGATIVE
Trichomonas: NEGATIVE

## 2023-07-04 LAB — MONITOR DRUG PROFILE 14(MW)
Amphetamine Scrn, Ur: NEGATIVE ng/mL
BARBITURATE SCREEN URINE: NEGATIVE ng/mL
BENZODIAZEPINE SCREEN, URINE: NEGATIVE ng/mL
Buprenorphine, Urine: NEGATIVE ng/mL
CANNABINOIDS UR QL SCN: NEGATIVE ng/mL
Cocaine (Metab) Scrn, Ur: NEGATIVE ng/mL
Creatinine(Crt), U: 45.5 mg/dL (ref 20.0–300.0)
Fentanyl, Urine: NEGATIVE pg/mL
Meperidine Screen, Urine: NEGATIVE ng/mL
Methadone Screen, Urine: NEGATIVE ng/mL
OXYCODONE+OXYMORPHONE UR QL SCN: NEGATIVE ng/mL
Opiate Scrn, Ur: NEGATIVE ng/mL
Ph of Urine: 6.9 (ref 4.5–8.9)
Phencyclidine Qn, Ur: NEGATIVE ng/mL
Propoxyphene Scrn, Ur: NEGATIVE ng/mL
SPECIFIC GRAVITY: 1.01
Tramadol Screen, Urine: NEGATIVE ng/mL

## 2023-07-04 LAB — URINE CULTURE, OB REFLEX

## 2023-07-04 LAB — NICOTINE SCREEN, URINE: Cotinine Ql Scrn, Ur: NEGATIVE ng/mL

## 2023-07-04 LAB — CULTURE, OB URINE

## 2023-07-06 LAB — CBC/D/PLT+RPR+RH+ABO+RUBIGG...
Antibody Screen: NEGATIVE
Basophils Absolute: 0 10*3/uL (ref 0.0–0.2)
Basos: 0 %
EOS (ABSOLUTE): 0.2 10*3/uL (ref 0.0–0.4)
Eos: 3 %
HCV Ab: NONREACTIVE
HIV Screen 4th Generation wRfx: NONREACTIVE
Hematocrit: 29.7 % — ABNORMAL LOW (ref 34.0–46.6)
Hemoglobin: 9.5 g/dL — ABNORMAL LOW (ref 11.1–15.9)
Hepatitis B Surface Ag: NEGATIVE
Immature Grans (Abs): 0 10*3/uL (ref 0.0–0.1)
Immature Granulocytes: 1 %
Lymphocytes Absolute: 1.4 10*3/uL (ref 0.7–3.1)
Lymphs: 24 %
MCH: 23.5 pg — ABNORMAL LOW (ref 26.6–33.0)
MCHC: 32 g/dL (ref 31.5–35.7)
MCV: 73 fL — ABNORMAL LOW (ref 79–97)
Monocytes Absolute: 0.3 10*3/uL (ref 0.1–0.9)
Monocytes: 5 %
Neutrophils Absolute: 4.1 10*3/uL (ref 1.4–7.0)
Neutrophils: 67 %
Platelets: 366 10*3/uL (ref 150–450)
RBC: 4.05 x10E6/uL (ref 3.77–5.28)
RDW: 15.3 % (ref 11.7–15.4)
RPR Ser Ql: NONREACTIVE
Rh Factor: POSITIVE
Rubella Antibodies, IGG: 4.31 {index} (ref >0.99)
Varicella zoster IgG: REACTIVE
WBC: 6 10*3/uL (ref 3.4–10.8)

## 2023-07-06 LAB — HCV INTERPRETATION

## 2023-07-06 LAB — COMPREHENSIVE METABOLIC PANEL
ALT: 17 IU/L (ref 0–32)
AST: 19 IU/L (ref 0–40)
Albumin: 4 g/dL (ref 3.9–4.9)
Alkaline Phosphatase: 47 IU/L (ref 44–121)
BUN/Creatinine Ratio: 20 (ref 9–23)
BUN: 15 mg/dL (ref 6–20)
Bilirubin Total: <0.2 mg/dL (ref 0.0–1.2)
CO2: 19 mmol/L — ABNORMAL LOW (ref 20–29)
Calcium: 9.8 mg/dL (ref 8.7–10.2)
Chloride: 101 mmol/L (ref 96–106)
Creatinine, Ser: 0.76 mg/dL (ref 0.57–1.00)
Globulin, Total: 3.1 g/dL (ref 1.5–4.5)
Glucose: 85 mg/dL (ref 70–99)
Potassium: 4.2 mmol/L (ref 3.5–5.2)
Sodium: 138 mmol/L (ref 134–144)
Total Protein: 7.1 g/dL (ref 6.0–8.5)
eGFR: 105 mL/min/{1.73_m2} (ref >59)

## 2023-07-06 LAB — HGB FRACTIONATION CASCADE
Hgb A2: 2.6 % (ref 1.8–3.2)
Hgb A: 96.6 % (ref 96.4–98.8)
Hgb F: 0.8 % (ref 0.0–2.0)
Hgb S: 0 %

## 2023-07-06 LAB — HEMOGLOBIN A1C
Est. average glucose Bld gHb Est-mCnc: 97 mg/dL
Hgb A1c MFr Bld: 5 % (ref 4.8–5.6)

## 2023-07-07 ENCOUNTER — Encounter: Payer: Self-pay | Admitting: Certified Nurse Midwife

## 2023-07-07 ENCOUNTER — Other Ambulatory Visit: Payer: Self-pay | Admitting: Certified Nurse Midwife

## 2023-07-07 DIAGNOSIS — D649 Anemia, unspecified: Secondary | ICD-10-CM | POA: Insufficient documentation

## 2023-07-07 DIAGNOSIS — O99019 Anemia complicating pregnancy, unspecified trimester: Secondary | ICD-10-CM | POA: Insufficient documentation

## 2023-07-07 LAB — MATERNIT 21 PLUS CORE, BLOOD
Fetal Fraction: 33
Result (T21): NEGATIVE
Trisomy 13 (Patau syndrome): NEGATIVE
Trisomy 18 (Edwards syndrome): NEGATIVE
Trisomy 21 (Down syndrome): NEGATIVE

## 2023-07-07 MED ORDER — FERROUS SULFATE 325 (65 FE) MG PO TABS: 325.0000 mg | ORAL_TABLET | Freq: Every day | ORAL | 3 refills | Status: DC

## 2023-07-30 ENCOUNTER — Ambulatory Visit (INDEPENDENT_AMBULATORY_CARE_PROVIDER_SITE_OTHER): Payer: PRIVATE HEALTH INSURANCE

## 2023-07-30 VITALS — BP 111/73 | HR 108 | Wt 150.0 lb

## 2023-07-30 DIAGNOSIS — Z3480 Encounter for supervision of other normal pregnancy, unspecified trimester: Secondary | ICD-10-CM

## 2023-07-30 DIAGNOSIS — D649 Anemia, unspecified: Secondary | ICD-10-CM

## 2023-07-30 DIAGNOSIS — Z3A25 25 weeks gestation of pregnancy: Secondary | ICD-10-CM

## 2023-07-30 NOTE — Assessment & Plan Note (Signed)
 Doing well with  po Iron supplementation. CBC at next visit.

## 2023-07-30 NOTE — Assessment & Plan Note (Signed)
 Doing well with no concerns today. Guidance given for  28 week labs and screenings at next visit, all questions answered.

## 2023-07-30 NOTE — Progress Notes (Signed)
    Return Prenatal Note   Assessment/Plan   Plan  33 y.o. [phone] at [redacted]w[redacted]d presents for follow-up OB visit. Reviewed prenatal record including previous visit note.  Anemia Doing well with  po Iron supplementation. CBC at next visit.  Supervision of other normal pregnancy, antepartum Doing well with no concerns today. Guidance given for  28 week labs and screenings at next visit, all questions answered.    No orders of the defined types were placed in this encounter.  Return in about 3 weeks (around 08/20/2023) for ROB with 1 hour glucola.   Future Appointments  Date Time Provider Department Center  08/20/2023  9:00 AM [facility] None  08/20/2023  9:35 AM Doreene Burke, CNM [facility] None    For next visit:  ROB with 1 hour gluocla, third trimester labs, and Tdap     Subjective   33 y.o. [id number] at [redacted]w[redacted]d presents for this follow-up prenatal visit.  Patient doing well with no concerns. Patient reports: Movement: Present Contractions: Not present  Objective   Flow sheet Vitals: Pulse Rate: (!) 108 BP: 111/73 Fundal Height: 26 cm Fetal Heart Rate (bpm): 140 Total weight gain: 11.3 kg  General Appearance  No acute distress, well appearing, and well nourished Pulmonary   Normal work of breathing Neurologic   Alert and oriented to person, place, and time Psychiatric   Mood and affect within normal limits  Adelfa Koh [place]s, Student-MidWife  07/29/2509:56 AM

## 2023-08-20 ENCOUNTER — Ambulatory Visit (INDEPENDENT_AMBULATORY_CARE_PROVIDER_SITE_OTHER): Payer: PRIVATE HEALTH INSURANCE | Admitting: Certified Nurse Midwife

## 2023-08-20 ENCOUNTER — Other Ambulatory Visit: Payer: PRIVATE HEALTH INSURANCE

## 2023-08-20 ENCOUNTER — Other Ambulatory Visit: Payer: Self-pay

## 2023-08-20 VITALS — BP 99/67 | HR 116 | Wt 150.6 lb

## 2023-08-20 DIAGNOSIS — Z3A28 28 weeks gestation of pregnancy: Secondary | ICD-10-CM

## 2023-08-20 DIAGNOSIS — Z3483 Encounter for supervision of other normal pregnancy, third trimester: Secondary | ICD-10-CM

## 2023-08-20 DIAGNOSIS — D649 Anemia, unspecified: Secondary | ICD-10-CM

## 2023-08-20 DIAGNOSIS — Z23 Encounter for immunization: Secondary | ICD-10-CM | POA: Diagnosis not present

## 2023-08-20 NOTE — Progress Notes (Signed)
 ROB doing well. Feels good movement. 28 wk labs today: Glucose screen/RPR/CBC. Tdap done, Blood transfusion consent completed, all questions answered.  Sample birth plan given, will follow up in upcoming visits. Discussed birth control after delivery. She desires to have her tube tied. BTL consent completed.    Follow up 2 wk  for ROB or sooner if needed.    Alise Appl, CNM

## 2023-08-20 NOTE — Patient Instructions (Signed)
 Oral Glucose Tolerance Test During Pregnancy Why am I having this test? The oral glucose tolerance test (OGTT) is done to check how your body uses blood sugar, also called glucose. It's one of many tests used to diagnose the type of diabetes you can get while pregnant. This type of diabetes is called gestational diabetes mellitus (GDM). You may get GDM during the middle part of your pregnancy. In most cases, it goes away after you give birth. Most people get tested for GDM around weeks 24-28 of pregnancy. You may have the test sooner if: You or your mother had diabetes while pregnant. A person in your family has diabetes. You're having more than one baby this pregnancy. You've had a baby before who weighed more than 9 pounds (4 kg) at birth. You have high blood pressure or heart disease. You have a large body. You're not active. What is being tested? This test measures your blood sugar at different times. It shows how well your body uses the sugar in your blood. What kind of sample is taken?  A sample of blood is needed for this test. The sample is taken by putting a needle into a blood vessel. How do I prepare for this test? Eat your normal meals the day before the test. Your health care provider will tell you about: Eating or drinking on the day of the test. You may need to fast for 8-10 hours before the test. When you fast, you can only have water. Changing or stopping your regular medicines. Some medicines may affect your test results. Tell a health care provider about: All medicines you take. These include vitamins, herbs, eye drops, and creams. What happens during the test? The test involves these steps: Your blood sugar will be checked. It's called your fasting blood sugar if you fasted before the test. You'll drink a sugary mixture. Your blood sugar will be checked again. For a 1-hour test, it will be checked after an hour. For a 3-hour test, it will be checked 1, 2, and 3 hours  after you drink the sugary mixture. The test takes 1-3 hours. You'll need to stay at the testing place during this time. During the testing time: Do not eat or drink anything after the sugary drink. Do not exercise. Do not use any products that contain nicotine or tobacco. These products include cigarettes, chewing tobacco, and vaping devices, such as e-cigarettes. The test may vary among providers and hospitals. How are the results reported? Your provider will compare your results to normal values for the kind of test that you had done. You may need to call or meet with your provider to get your results. What do the results mean? Your provider can tell you what blood sugar levels are normal for the test you're doing. If two or more of your blood sugar levels are at or above normal, you may be diagnosed with GDM. If only one level is high, your provider may suggest: Doing the test again. Doing other tests to confirm a diagnosis. Talk with your provider about what your results mean. Questions to ask your health care provider Ask your provider, or the department doing the test: When will my results be ready? How will I get my results? What are my next steps? This information is not intended to replace advice given to you by your health care provider. Make sure you discuss any questions you have with your health care provider. Document Revised: 08/12/2022 Document Reviewed: 08/12/2022 Elsevier Patient Education  2024 Elsevier Inc[employer]

## 2023-08-21 LAB — 28 WEEK RH+PANEL
Basophils Absolute: 0 10*3/uL (ref 0.0–0.2)
Basos: 0 %
EOS (ABSOLUTE): 0.1 10*3/uL (ref 0.0–0.4)
Eos: 3 %
Gestational Diabetes Screen: 75 mg/dL (ref 70–139)
HIV Screen 4th Generation wRfx: NONREACTIVE
Hematocrit: 25.5 % — ABNORMAL LOW (ref 34.0–46.6)
Hemoglobin: 8.3 g/dL — ABNORMAL LOW (ref 11.1–15.9)
Immature Grans (Abs): 0 10*3/uL (ref 0.0–0.1)
Immature Granulocytes: 1 %
Lymphocytes Absolute: 1 10*3/uL (ref 0.7–3.1)
Lymphs: 20 %
MCH: 23.6 pg — ABNORMAL LOW (ref 26.6–33.0)
MCHC: 32.5 g/dL (ref 31.5–35.7)
MCV: 72 fL — ABNORMAL LOW (ref 79–97)
Monocytes Absolute: 0.3 10*3/uL (ref 0.1–0.9)
Monocytes: 5 %
Neutrophils Absolute: 3.6 10*3/uL (ref 1.4–7.0)
Neutrophils: 71 %
Platelets: 315 10*3/uL (ref 150–450)
RBC: 3.52 x10E6/uL — ABNORMAL LOW (ref 3.77–5.28)
RDW: 13.5 % (ref 11.7–15.4)
RPR Ser Ql: NONREACTIVE
WBC: 5.1 10*3/uL (ref 3.4–10.8)

## 2023-08-29 ENCOUNTER — Ambulatory Visit
Admission: RE | Admit: 2023-08-29 | Discharge: 2023-08-29 | Disposition: A | Discharge status: Home / Self Care | Payer: PRIVATE HEALTH INSURANCE | Source: Ambulatory Visit

## 2023-08-29 DIAGNOSIS — D649 Anemia, unspecified: Secondary | ICD-10-CM | POA: Insufficient documentation

## 2023-08-29 DIAGNOSIS — Z01812 Encounter for preprocedural laboratory examination: Secondary | ICD-10-CM | POA: Insufficient documentation

## 2023-08-29 LAB — CBC
HCT: 25.8 % — ABNORMAL LOW (ref 36.0–46.0)
Hemoglobin: 8.2 g/dL — ABNORMAL LOW (ref 12.0–15.0)
MCH: 23.9 pg — ABNORMAL LOW (ref 26.0–34.0)
MCHC: 31.8 g/dL (ref 30.0–36.0)
MCV: 75.2 fL — ABNORMAL LOW (ref 80.0–100.0)
Platelets: 312 10*3/uL (ref 150–400)
RBC: 3.43 MIL/uL — ABNORMAL LOW (ref 3.87–5.11)
RDW: 14.6 % (ref 11.5–15.5)
WBC: 5.2 10*3/uL (ref 4.0–10.5)
nRBC: 0 % (ref 0.0–0.2)

## 2023-08-29 MED ORDER — IRON SUCROSE 500 MG IVPB - SIMPLE MED
500.0000 mg | INTRAVENOUS | Status: DC
  Administered 2023-08-29: 500 mg via INTRAVENOUS
  Filled 2023-08-29: qty 275

## 2023-09-03 ENCOUNTER — Ambulatory Visit (INDEPENDENT_AMBULATORY_CARE_PROVIDER_SITE_OTHER): Payer: PRIVATE HEALTH INSURANCE | Admitting: Certified Nurse Midwife

## 2023-09-03 ENCOUNTER — Encounter: Payer: Self-pay | Admitting: Certified Nurse Midwife

## 2023-09-03 VITALS — BP 119/76 | HR 116 | Wt 154.8 lb

## 2023-09-03 DIAGNOSIS — Z3480 Encounter for supervision of other normal pregnancy, unspecified trimester: Secondary | ICD-10-CM | POA: Diagnosis not present

## 2023-09-03 NOTE — Progress Notes (Signed)
    Return Prenatal Note   Assessment/Plan   Plan  33 y.o. [id number] at [redacted]w[redacted]d presents for follow-up OB visit. Reviewed prenatal record including previous visit note.  Supervision of other normal pregnancy, antepartum Doing well. Feels a little more energy with the last iron infusion. She has another one this Friday.  Had a good Mothers Day! Reviewed kick counts and preterm labor warning signs. Instructed to call office or come to hospital with persistent headache, vision changes, regular contractions, leaking of fluid, decreased fetal movement or vaginal bleeding.    No orders of the defined types were placed in this encounter.  No follow-ups on file.   Future Appointments  Date Time Provider Department Center  09/05/2023  2:00 PM [facility] None  09/12/2023  2:00 PM [facility] None  09/18/2023  2:35 PM Malak Duchesneau, Sherline Distel, CNM [facility] None    For next visit:  continue with routine prenatal care     Subjective   33 y.o. [id number] at [redacted]w[redacted]d presents for this follow-up prenatal visit.  Patient feeling well overall.  Patient reports: Movement: Present Contractions: Not present  Objective   Flow sheet Vitals: Pulse Rate: (!) 116 BP: 119/76 Fundal Height: 30 cm Fetal Heart Rate (bpm): 135 Total weight gain: 29 lb 12.8 oz (13.5 kg)  General Appearance  No acute distress, well appearing, and well nourished Pulmonary   Normal work of breathing Neurologic   Alert and oriented to person, place, and time Psychiatric   Mood and affect within normal limits  Donato Fu, CNM  05/14/253:08 PM

## 2023-09-03 NOTE — Assessment & Plan Note (Signed)
 Doing well. Feels a little more energy with the last iron infusion. She has another one this Friday.  Had a good Mothers Day! Reviewed kick counts and preterm labor warning signs. Instructed to call office or come to hospital with persistent headache, vision changes, regular contractions, leaking of fluid, decreased fetal movement or vaginal bleeding.

## 2023-09-05 ENCOUNTER — Ambulatory Visit
Admission: RE | Admit: 2023-09-05 | Discharge: 2023-09-05 | Disposition: A | Discharge status: Home / Self Care | Payer: PRIVATE HEALTH INSURANCE | Source: Ambulatory Visit

## 2023-09-05 DIAGNOSIS — D649 Anemia, unspecified: Secondary | ICD-10-CM | POA: Insufficient documentation

## 2023-09-05 LAB — CBC
HCT: 25.3 % — ABNORMAL LOW (ref 36.0–46.0)
Hemoglobin: 8.2 g/dL — ABNORMAL LOW (ref 12.0–15.0)
MCH: 23.8 pg — ABNORMAL LOW (ref 26.0–34.0)
MCHC: 32.4 g/dL (ref 30.0–36.0)
MCV: 73.5 fL — ABNORMAL LOW (ref 80.0–100.0)
Platelets: 294 10*3/uL (ref 150–400)
RBC: 3.44 MIL/uL — ABNORMAL LOW (ref 3.87–5.11)
RDW: 14.7 % (ref 11.5–15.5)
WBC: 5.9 10*3/uL (ref 4.0–10.5)
nRBC: 0 % (ref 0.0–0.2)

## 2023-09-05 MED ORDER — IRON SUCROSE 500 MG IVPB - SIMPLE MED
500.0000 mg | Freq: Once | INTRAVENOUS | Status: AC
  Administered 2023-09-05: 500 mg via INTRAVENOUS
  Filled 2023-09-05: qty 500

## 2023-09-12 ENCOUNTER — Ambulatory Visit
Admission: RE | Admit: 2023-09-12 | Discharge: 2023-09-12 | Disposition: A | Discharge status: Home / Self Care | Payer: PRIVATE HEALTH INSURANCE | Source: Ambulatory Visit

## 2023-09-12 DIAGNOSIS — D509 Iron deficiency anemia, unspecified: Secondary | ICD-10-CM | POA: Diagnosis present

## 2023-09-12 DIAGNOSIS — O99013 Anemia complicating pregnancy, third trimester: Secondary | ICD-10-CM | POA: Diagnosis not present

## 2023-09-12 DIAGNOSIS — D649 Anemia, unspecified: Secondary | ICD-10-CM

## 2023-09-12 LAB — CBC
HCT: 25.5 % — ABNORMAL LOW (ref 36.0–46.0)
Hemoglobin: 8.1 g/dL — ABNORMAL LOW (ref 12.0–15.0)
MCH: 23.7 pg — ABNORMAL LOW (ref 26.0–34.0)
MCHC: 31.8 g/dL (ref 30.0–36.0)
MCV: 74.6 fL — ABNORMAL LOW (ref 80.0–100.0)
Platelets: 268 10*3/uL (ref 150–400)
RBC: 3.42 MIL/uL — ABNORMAL LOW (ref 3.87–5.11)
RDW: 14.8 % (ref 11.5–15.5)
WBC: 5.8 10*3/uL (ref 4.0–10.5)
nRBC: 0 % (ref 0.0–0.2)

## 2023-09-12 MED ORDER — IRON SUCROSE 500 MG IVPB - SIMPLE MED
500.0000 mg | Freq: Once | INTRAVENOUS | Status: AC
Start: 2023-09-12 — End: 2023-09-12
  Administered 2023-09-12: 500 mg via INTRAVENOUS
  Filled 2023-09-12: qty 275
  Filled 2023-09-12: qty 500

## 2023-09-12 MED ORDER — SODIUM CHLORIDE 0.9 % IV SOLN: INTRAVENOUS | Status: DC | PRN

## 2023-09-18 ENCOUNTER — Ambulatory Visit (INDEPENDENT_AMBULATORY_CARE_PROVIDER_SITE_OTHER): Payer: PRIVATE HEALTH INSURANCE | Admitting: Certified Nurse Midwife

## 2023-09-18 ENCOUNTER — Encounter: Payer: Self-pay | Admitting: Certified Nurse Midwife

## 2023-09-18 VITALS — BP 112/59 | HR 115 | Wt 156.5 lb

## 2023-09-18 DIAGNOSIS — Z3480 Encounter for supervision of other normal pregnancy, unspecified trimester: Secondary | ICD-10-CM

## 2023-09-18 DIAGNOSIS — D649 Anemia, unspecified: Secondary | ICD-10-CM

## 2023-09-18 NOTE — Assessment & Plan Note (Signed)
 Feeling well overall. Has scan at [facility] on 6/17 for breast cancer follow up.  Reviewed kick counts and preterm labor warning signs. Instructed to call office or come to hospital with persistent headache, vision changes, regular contractions, leaking of fluid, decreased fetal movement or vaginal bleeding.

## 2023-09-18 NOTE — Assessment & Plan Note (Signed)
 Done with 3 Fe infusions but hemoglobin has not improved. Of note, she needed a blood transfusion during chemotherapy to improve her anemia during treatment. She has had low iron  with all of her pregnancies that has never improved with supplementation or Fe infusions. Additionally, she has a known alpha thalassemia trait. Will consult with physicians for plan of care for her.

## 2023-09-18 NOTE — Progress Notes (Unsigned)
    Return Prenatal Note   Assessment/Plan   Plan  33 y.o. [phone] at [redacted]w[redacted]d presents for follow-up OB visit. Reviewed prenatal record including previous visit note.  Anemia Done with 3 Fe infusions but hemoglobin has not improved. Of note, she needed a blood transfusion during chemotherapy to improve her anemia during treatment. She has had low iron  with all of her pregnancies that has never improved with supplementation or Fe infusions. Additionally, she has a known alpha thalassemia trait. Will consult with physicians for plan of care for her.   Supervision of other normal pregnancy, antepartum Feeling well overall. Has scan at [facility] on 6/17 for breast cancer follow up.  Reviewed kick counts and preterm labor warning signs. Instructed to call office or come to hospital with persistent headache, vision changes, regular contractions, leaking of fluid, decreased fetal movement or vaginal bleeding.    No orders of the defined types were placed in this encounter.  No follow-ups on file.   Future Appointments  Date Time Provider Department Center  09/26/2023  2:55 PM Sofia Dunn, MD [facility] None    For next visit:  continue with routine prenatal care     Subjective   33 y.o. [phone] at [redacted]w[redacted]d presents for this follow-up prenatal visit.  Patient feeling well overall.  Patient reports: Movement: Present Contractions: Not present  Objective   Flow sheet Vitals: Pulse Rate: (!) 115 BP: (!) 112/59 Fundal Height: 32 cm Fetal Heart Rate (bpm): 150 Total weight gain: 31 lb 8 oz (14.3 kg)  General Appearance  No acute distress, well appearing, and well nourished Pulmonary   Normal work of breathing Neurologic   Alert and oriented to person, place, and time Psychiatric   Mood and affect within normal limits  Donato Fu, CNM  05/29/253:06 PM

## 2023-09-19 ENCOUNTER — Other Ambulatory Visit: Payer: Self-pay | Admitting: Certified Nurse Midwife

## 2023-09-19 DIAGNOSIS — D560 Alpha thalassemia: Secondary | ICD-10-CM

## 2023-09-19 DIAGNOSIS — D509 Iron deficiency anemia, unspecified: Secondary | ICD-10-CM

## 2023-09-19 DIAGNOSIS — Z3480 Encounter for supervision of other normal pregnancy, unspecified trimester: Secondary | ICD-10-CM

## 2023-09-23 ENCOUNTER — Encounter: Payer: Self-pay | Admitting: Oncology

## 2023-09-23 ENCOUNTER — Ambulatory Visit: Payer: Self-pay | Admitting: Oncology

## 2023-09-23 ENCOUNTER — Inpatient Hospital Stay: Payer: PRIVATE HEALTH INSURANCE | Attending: Oncology | Admitting: Oncology

## 2023-09-23 ENCOUNTER — Inpatient Hospital Stay: Payer: PRIVATE HEALTH INSURANCE

## 2023-09-23 VITALS — BP 113/66 | HR 113 | Temp 97.1°F | Resp 18 | Wt 156.0 lb

## 2023-09-23 DIAGNOSIS — D563 Thalassemia minor: Secondary | ICD-10-CM | POA: Diagnosis not present

## 2023-09-23 DIAGNOSIS — Z79899 Other long term (current) drug therapy: Secondary | ICD-10-CM | POA: Diagnosis not present

## 2023-09-23 DIAGNOSIS — O99019 Anemia complicating pregnancy, unspecified trimester: Secondary | ICD-10-CM

## 2023-09-23 DIAGNOSIS — Z331 Pregnant state, incidental: Secondary | ICD-10-CM | POA: Diagnosis not present

## 2023-09-23 DIAGNOSIS — R000 Tachycardia, unspecified: Secondary | ICD-10-CM | POA: Insufficient documentation

## 2023-09-23 DIAGNOSIS — I3139 Other pericardial effusion (noninflammatory): Secondary | ICD-10-CM | POA: Insufficient documentation

## 2023-09-23 DIAGNOSIS — D509 Iron deficiency anemia, unspecified: Secondary | ICD-10-CM | POA: Insufficient documentation

## 2023-09-23 DIAGNOSIS — Z803 Family history of malignant neoplasm of breast: Secondary | ICD-10-CM | POA: Diagnosis not present

## 2023-09-23 DIAGNOSIS — E538 Deficiency of other specified B group vitamins: Secondary | ICD-10-CM | POA: Diagnosis not present

## 2023-09-23 DIAGNOSIS — Z853 Personal history of malignant neoplasm of breast: Secondary | ICD-10-CM

## 2023-09-23 DIAGNOSIS — D569 Thalassemia, unspecified: Secondary | ICD-10-CM | POA: Diagnosis not present

## 2023-09-23 DIAGNOSIS — I7300 Raynaud's syndrome without gangrene: Secondary | ICD-10-CM | POA: Diagnosis not present

## 2023-09-23 DIAGNOSIS — Z3A00 Weeks of gestation of pregnancy not specified: Secondary | ICD-10-CM

## 2023-09-23 LAB — CBC WITH DIFFERENTIAL/PLATELET
Abs Immature Granulocytes: 0.09 10*3/uL — ABNORMAL HIGH (ref 0.00–0.07)
Basophils Absolute: 0 10*3/uL (ref 0.0–0.1)
Basophils Relative: 0 %
Eosinophils Absolute: 0.1 10*3/uL (ref 0.0–0.5)
Eosinophils Relative: 2 %
HCT: 25.5 % — ABNORMAL LOW (ref 36.0–46.0)
Hemoglobin: 8 g/dL — ABNORMAL LOW (ref 12.0–15.0)
Immature Granulocytes: 2 %
Lymphocytes Relative: 18 %
Lymphs Abs: 1 10*3/uL (ref 0.7–4.0)
MCH: 23.5 pg — ABNORMAL LOW (ref 26.0–34.0)
MCHC: 31.4 g/dL (ref 30.0–36.0)
MCV: 75 fL — ABNORMAL LOW (ref 80.0–100.0)
Monocytes Absolute: 0.3 10*3/uL (ref 0.1–1.0)
Monocytes Relative: 6 %
Neutro Abs: 3.9 10*3/uL (ref 1.7–7.7)
Neutrophils Relative %: 72 %
Platelets: 268 10*3/uL (ref 150–400)
RBC: 3.4 MIL/uL — ABNORMAL LOW (ref 3.87–5.11)
RDW: 15.1 % (ref 11.5–15.5)
WBC: 5.5 10*3/uL (ref 4.0–10.5)
nRBC: 0 % (ref 0.0–0.2)

## 2023-09-23 LAB — CMP (CANCER CENTER ONLY)
ALT: 15 U/L (ref 0–44)
AST: 18 U/L (ref 15–41)
Albumin: 3.1 g/dL — ABNORMAL LOW (ref 3.5–5.0)
Alkaline Phosphatase: 103 U/L (ref 38–126)
Anion gap: 9 (ref 5–15)
BUN: 11 mg/dL (ref 6–20)
CO2: 18 mmol/L — ABNORMAL LOW (ref 22–32)
Calcium: 8.6 mg/dL — ABNORMAL LOW (ref 8.9–10.3)
Chloride: 109 mmol/L (ref 98–111)
Creatinine: 0.44 mg/dL (ref 0.44–1.00)
GFR, Estimated: >60 mL/min (ref >60)
Glucose, Bld: 79 mg/dL (ref 70–99)
Potassium: 3.2 mmol/L — ABNORMAL LOW (ref 3.5–5.1)
Sodium: 136 mmol/L (ref 135–145)
Total Bilirubin: 0.4 mg/dL (ref 0.0–1.2)
Total Protein: 6.7 g/dL (ref 6.5–8.1)

## 2023-09-23 LAB — TECHNOLOGIST SMEAR REVIEW: Plt Morphology: ADEQUATE

## 2023-09-23 LAB — IRON AND TIBC
Iron: 85 ug/dL (ref 28–170)
Saturation Ratios: 21 % (ref 10.4–31.8)
TIBC: 414 ug/dL (ref 250–450)
UIBC: 329 ug/dL

## 2023-09-23 LAB — RETIC PANEL
Immature Retic Fract: 18.8 % — ABNORMAL HIGH (ref 2.3–15.9)
RBC.: 3.4 MIL/uL — ABNORMAL LOW (ref 3.87–5.11)
Retic Count, Absolute: 66 10*3/uL (ref 19.0–186.0)
Retic Ct Pct: 1.9 % (ref 0.4–3.1)
Reticulocyte Hemoglobin: 26.3 pg — ABNORMAL LOW (ref >27.9)

## 2023-09-23 LAB — FOLATE: Folate: 31 ng/mL (ref >5.9)

## 2023-09-23 LAB — FERRITIN: Ferritin: 423 ng/mL — ABNORMAL HIGH (ref 11–307)

## 2023-09-23 LAB — VITAMIN B12: Vitamin B-12: 196 pg/mL (ref 180–914)

## 2023-09-23 NOTE — Progress Notes (Signed)
 Hematology/Oncology Consult note [phone] [phone]        REFERRING PROVIDER: Slaughterbeck, Sherline Distel, C*   CHIEF COMPLAINTS/REASON FOR VISIT:  Evaluation of anemia due to pregnancy.   ASSESSMENT & PLAN:   Anemia during pregnancy Reviewed previous lab results. 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  Rule out other etiologies. Check CBC, smear, protein electrophoresis, CMP, reticulocyte panel, folate, vitamin B12, iron  TIBC ferritin.  Ferritin level is elevated in 400s likely secondary to recent empiric IV Venofer  treatments given by her midwife..  No baseline iron  levels were checked prior to IV Venofer  treatments.  I will hold off additional Venofer  treatments.   Thalassemia I will check alpha thalassemia PCR to confirm diagnosis.  History of breast cancer Follow-up with oncology at [facility].  B12 deficiency B12 level is less than 400.  I recommend patient to start with oral B12 supplementation 1000 mcg daily.  Over-the-counter supply.   Orders Placed This Encounter  Procedures   Iron  and TIBC    Standing Status:   Future    Number of Occurrences:   1    Expected Date:   09/23/2023    Expiration Date:   09/22/2024   Ferritin    Standing Status:   Future    Number of Occurrences:   1    Expected Date:   09/23/2023    Expiration Date:   03/24/2024   Vitamin B12    Standing Status:   Future    Number of Occurrences:   1    Expected Date:   09/23/2023    Expiration Date:   09/22/2024   Folate    Standing Status:   Future    Number of Occurrences:   1    Expected Date:   09/23/2023    Expiration Date:   09/22/2024   CBC with Differential/Platelet    Standing Status:   Future    Number of Occurrences:   1    Expected Date:   09/23/2023    Expiration Date:   09/22/2024   Retic Panel    Standing Status:   Future    Number of Occurrences:   1    Expected Date:   09/23/2023    Expiration  Date:   09/22/2024   Technologist smear review    Standing Status:   Future    Number of Occurrences:   1    Expected Date:   09/23/2023    Expiration Date:   09/22/2024    Clinical information::   anemia, alpha thal trait.   Protein electrophoresis, serum    Standing Status:   Future    Number of Occurrences:   1    Expected Date:   09/23/2023    Expiration Date:   09/22/2024   Alpha-Thalassemia GenotypR    Standing Status:   Future    Number of Occurrences:   1    Expected Date:   09/23/2023    Expiration Date:   09/22/2024   CMP (Cancer Center only)    Standing Status:   Future    Number of Occurrences:   1    Expected Date:   09/23/2023    Expiration Date:   09/22/2024   Follow-up to be determined. All questions were answered. The patient knows to call the clinic with any problems, questions or concerns.  Timmy Forbes, MD, PhD [facility] Hematology Oncology 09/23/2023   HISTORY OF PRESENTING ILLNESS:  Angel Pacheco is a  33 y.o.  female with PMH listed below was seen in consultation at the request of  Slaughterbeck, Sherline Distel, C*  for evaluation of anemia due to pregnancy.  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  Patient had neoadjuvant AC during pregnancy, she delivered her baby in March 2023, and finished carboplatin/Taxol/pembrolizumab neoadjuvantly. Patient underwent mastectomy in July 2023.  She received adjuvant radiation, adjuvant Keytruda.  She has residual disease after neoadjuvant therapy.  She also received adjuvant Xeloda after she finishes radiation.  Xeloda was finished in May 2024. There was concern of questionable lung nodules, suspicious for metastasis.  Biopsy was not conclusive.  Her oncology care has been with [facility] Dr. Patra Bonnet  - Patient reports a history of alpha thalassemia trait.  Review her previous medical history, her baseline hemoglobin is about lowe to  Mid 10s Her hemoglobin on 12/22/2021 was 10.  At that time, she has finished Xeloda and hemoglobin has resolved to close to baseline.  - Currently patient is pregnant, she is in third trimester. Hemoglobin has trended down to 8.1 on recent blood work.  No recent iron  panel was checked.  She was given 3 daily dose of IV Venofer  empirically by her midwife Slaughter.  There was no improvement of hemoglobin and patient was referred to establish care with me.  Today patient reports feeling well.  She reports feeling well at baseline.  She denies shortness of breath, palpitation, chest pain, lightheadedness.  She works in a Psychiatric nurse.  Patient has chronic tachycardia, mild pericardial effusion  MEDICAL HISTORY:  Past Medical History:  Diagnosis Date   Allergic rhinitis    Anemia    Breast cancer (HCC)    Raynaud phenomenon    Thalassemia     SURGICAL HISTORY: Past Surgical History:  Procedure Laterality Date   NO PAST SURGERIES      SOCIAL HISTORY: Social History   Socioeconomic History   Marital status: Married    Spouse name: Denzel   Number of children: 3   Years of education: 13   Highest education level: Some college, no degree  Occupational History   Occupation: Audiological scientist  Tobacco Use   Smoking status: Never    Passive exposure: Current   Smokeless tobacco: Never  Vaping Use   Vaping status: Never Used  Substance and Sexual Activity   Alcohol use: No   Drug use: No   Sexual activity: Yes    Partners: Male    Birth control/protection: None  Other Topics Concern   Not on file  Social History Narrative   Not on file   Social Drivers of Health   Financial Resource Strain: Not on file  Food Insecurity: No Food Insecurity (09/23/2023)   Hunger Vital Sign    Worried About Running Out of Food in the Last Year: Never true    Ran Out of Food in the Last Year: Never true  Transportation Needs: No Transportation Needs (06/23/2023)   PRAPARE - Therapist, art (Medical): No    Lack of Transportation (Non-Medical): No  Physical Activity: Not on file  Stress: No Stress Concern Present (09/23/2023)   [employer] of Occupational Health - Occupational Stress Questionnaire    Feeling of Stress : Not at all  Social Connections: Not on file  Intimate Partner Violence: Not At Risk (06/23/2023)   Humiliation, Afraid, Rape, and  Kick questionnaire    Fear of Current or Ex-Partner: No    Emotionally Abused: No    Physically Abused: No    Sexually Abused: No    FAMILY HISTORY: Family History  Problem Relation Age of Onset   Diabetes Mother    Stroke Father    Gestational diabetes Sister    Asthma Maternal Grandmother    Breast cancer Maternal Aunt        great aunts   Colon cancer Neg Hx     ALLERGIES:  has no known allergies.  MEDICATIONS:  Current Outpatient Medications  Medication Sig Dispense Refill   ferrous sulfate  325 (65 FE) MG tablet Take 1 tablet (325 mg total) by mouth daily. 60 tablet 3   Prenatal Vit-Fe Fumarate-FA (MULTIVITAMIN-PRENATAL) 27-0.8 MG TABS tablet Take 1 tablet by mouth daily at 12 noon.     No current facility-administered medications for this visit.    Review of Systems  Constitutional:  Negative for appetite change, chills, fatigue and fever.  HENT:   Negative for hearing loss and voice change.   Eyes:  Negative for eye problems.  Respiratory:  Negative for chest tightness and cough.   Cardiovascular:  Negative for chest pain.  Gastrointestinal:  Negative for abdominal distention, abdominal pain and blood in stool.  Endocrine: Negative for hot flashes.  Genitourinary:  Negative for difficulty urinating and frequency.   Musculoskeletal:  Negative for arthralgias.  Skin:  Negative for itching and rash.  Neurological:  Negative for extremity weakness.  Hematological:  Negative for adenopathy.  Psychiatric/Behavioral:  Negative for confusion.    PHYSICAL EXAMINATION: ECOG PERFORMANCE  STATUS: 0 - Asymptomatic Vitals:   09/23/23 1508  BP: 113/66  Pulse: (!) 113  Resp: 18  Temp: (!) 97.1 F (36.2 C)  SpO2: 100%   Filed Weights   09/23/23 1508  Weight: 156 lb (70.8 kg)    Physical Exam Constitutional:      General: She is not in acute distress. HENT:     Head: Normocephalic and atraumatic.  Eyes:     General: No scleral icterus. Cardiovascular:     Rate and Rhythm: Normal rate and regular rhythm.     Heart sounds: Normal heart sounds.  Pulmonary:     Effort: Pulmonary effort is normal. No respiratory distress.     Breath sounds: No wheezing.  Abdominal:     Comments: Gravid uterus  Musculoskeletal:        General: No deformity. Normal range of motion.     Cervical back: Normal range of motion and neck supple.  Skin:    General: Skin is warm and dry.     Findings: No erythema or rash.  Neurological:     Mental Status: She is alert and oriented to person, place, and time. Mental status is at baseline.     Cranial Nerves: No cranial nerve deficit.     Coordination: Coordination normal.  Psychiatric:        Mood and Affect: Mood normal.     LABORATORY DATA:  I have reviewed the data as listed    Latest Ref Rng & Units 09/23/2023    3:57 PM 09/12/2023    6:12 PM 09/05/2023    2:26 PM  CBC  WBC 4.0 - 10.5 K/uL 5.5  5.8  5.9   Hemoglobin 12.0 - 15.0 g/dL 8.0  8.1  8.2   Hematocrit 36.0 - 46.0 % 25.5  25.5  25.3   Platelets 150 - 400 K/uL  268  268  294       Latest Ref Rng & Units 09/23/2023    3:58 PM 07/02/2023    2:50 PM 12/22/2021    9:30 PM  CMP  Glucose 70 - 99 mg/dL 79  85  92   BUN 6 - 20 mg/dL 11  15  17    Creatinine 0.44 - 1.00 mg/dL 1.61  0.96  0.45   Sodium 135 - 145 mmol/L 136  138  141   Potassium 3.5 - 5.1 mmol/L 3.2  4.2  3.4   Chloride 98 - 111 mmol/L 109  101  109   CO2 22 - 32 mmol/L 18  19  23    Calcium 8.9 - 10.3 mg/dL 8.6  9.8  40.9   Total Protein 6.5 - 8.1 g/dL 6.7  7.1  8.6   Total Bilirubin 0.0 - 1.2 mg/dL 0.4  <8.1   0.4   Alkaline Phos 38 - 126 U/L 103  47  47   AST 15 - 41 U/L 18  19  21    ALT 0 - 44 U/L 15  17  18        RADIOGRAPHIC STUDIES: I have personally reviewed the radiological images as listed and agreed with the findings in the report. US  OB Comp + 14 Wk Result Date: 07/06/2023 Patient Name: KEILY LEPP DOB: 04/24/1990 MRN: [id number] LMP: ULTRASOUND REPORT Location: [facility] Date of Service: 07/02/2023 Indications:Anatomy Ultrasound, late prenatal care in the second trimester Findings: Singleton intrauterine pregnancy is visualized with FHR at 150 bpm. Bennett Brass gives an (U/S) Gestational age of [redacted]w[redacted]d and an (U/S) EDD of 11/12/2023; this correlates with the clinically established Estimated Date of Delivery: 11/12/23 Fetal presentation is Cephalic. EFW: 395g, 0lb 14 oz; in the 46th percentile. Placenta: anterior with placental cord insert visualized MVP: 4.9 cm  4CH, rvot, lvot, 3vv, 3vtv, diaphragam, profile, nose/lip, posterior fossa, choroid plexus, lateral ventricle, cavum septi pellucidi, spine, stomach, bladder, kidneys, abdominal cord insert, upper extremities, lower extremities, hands, feet and situs were all visualized and appear normal during todays exam. Anatomic survey is complete and appears normal Gender - female.  Cervix appears long and closed measuring 4.2 cm. Right Ovary: is normal in appearance. Left Ovary: is normal appearance. Survey of the adnexa demonstrates no adnexal masses. There is no free peritoneal fluid in the cul de sac. Impression: 1. [redacted]w[redacted]d Viable Singleton Intrauterine pregnancy by U/S. 2. (U/S) EDD is consistent with Clinically established Estimated Date of Delivery: 11/12/23 . 3. Normal Anatomy Scan Recommendations: 1.Clinical correlation with the patient's History and Physical Exam. Lennie Ra, RDMS I have reviewed this study and agree with documented findings. Teresa Fender, MD  OB/GYN

## 2023-09-23 NOTE — Assessment & Plan Note (Signed)
 B12 level is less than 400.  I recommend patient to start with oral B12 supplementation 1000 mcg daily.  Over-the-counter supply.

## 2023-09-23 NOTE — Assessment & Plan Note (Signed)
 Follow-up with oncology at [facility].

## 2023-09-23 NOTE — Assessment & Plan Note (Signed)
 I will check alpha thalassemia PCR to confirm diagnosis.

## 2023-09-23 NOTE — Assessment & Plan Note (Addendum)
 Reviewed previous lab results. 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  Rule out other etiologies. Check CBC, smear, protein electrophoresis, CMP, reticulocyte panel, folate, vitamin B12, iron  TIBC ferritin.  Ferritin level is elevated in 400s likely secondary to recent empiric IV Venofer  treatments given by her midwife..  No baseline iron  levels were checked prior to IV Venofer  treatments.  I will hold off additional Venofer  treatments.

## 2023-09-24 LAB — PROTEIN ELECTROPHORESIS, SERUM
A/G Ratio: 0.9 (ref 0.7–1.7)
Albumin ELP: 3 g/dL (ref 2.9–4.4)
Alpha-1-Globulin: 0.3 g/dL (ref 0.0–0.4)
Alpha-2-Globulin: 0.7 g/dL (ref 0.4–1.0)
Beta Globulin: 1.3 g/dL (ref 0.7–1.3)
Gamma Globulin: 0.9 g/dL (ref 0.4–1.8)
Globulin, Total: 3.2 g/dL (ref 2.2–3.9)
Total Protein ELP: 6.2 g/dL (ref 6.0–8.5)

## 2023-09-24 NOTE — Telephone Encounter (Signed)
 Spoke to pt and informed her of B12 and Potassium levels along with MD recommendation. Patient verbalized understanding.

## 2023-09-24 NOTE — Telephone Encounter (Signed)
-----   Message from Timmy Forbes sent at 09/23/2023 10:49 PM EDT ----- Please let patient know that her B12 level is borderline I recommend over-the-counter vitamin B12 1000 mcg daily.  Her potassium level is low.  Recommend potassium rich food.  Other blood work results are still pending.

## 2023-09-26 ENCOUNTER — Ambulatory Visit (INDEPENDENT_AMBULATORY_CARE_PROVIDER_SITE_OTHER): Payer: PRIVATE HEALTH INSURANCE | Admitting: Obstetrics

## 2023-09-26 VITALS — BP 105/57 | HR 117 | Wt 156.0 lb

## 2023-09-26 DIAGNOSIS — D560 Alpha thalassemia: Secondary | ICD-10-CM | POA: Diagnosis not present

## 2023-09-26 DIAGNOSIS — D649 Anemia, unspecified: Secondary | ICD-10-CM | POA: Diagnosis not present

## 2023-09-26 DIAGNOSIS — O0993 Supervision of high risk pregnancy, unspecified, third trimester: Secondary | ICD-10-CM | POA: Diagnosis not present

## 2023-09-26 DIAGNOSIS — Z3A33 33 weeks gestation of pregnancy: Secondary | ICD-10-CM | POA: Diagnosis not present

## 2023-09-26 DIAGNOSIS — Z853 Personal history of malignant neoplasm of breast: Secondary | ICD-10-CM

## 2023-09-26 NOTE — Progress Notes (Signed)
    Return Prenatal Note   Subjective  33 y.o. [id number] at [redacted]w[redacted]d presents for this follow-up prenatal visit.   Patient has no concerns today, saw hematology on 6/3 had labs done. Hgb 8.0. Desires BTL, consent signed 5/13  Patient reports: Movement: Present Contractions: Not present Denies vaginal bleeding or leaking fluid. Objective  Flow sheet Vitals: Pulse Rate: (!) 117 BP: (!) 105/57 Total weight gain: 31 lb (14.1 kg)  General Appearance  No acute distress, well appearing, and well nourished Pulmonary   Normal work of breathing Neurologic   Alert and oriented to person, place, and time Psychiatric   Mood and affect within normal limits   Assessment/Plan   Plan  33 y.o. [id number] at [redacted]w[redacted]d by LMP=21wk US  presents for follow-up OB visit. Reviewed prenatal record including previous visit note.  1. Supervision of high risk pregnancy in third trimester (Primary) -Pt planning BTL for contraception, consent signed 5/13 -PTL precautions reviewed  2. Anemia, unspecified type 3. Alpha-thalassemia (HCC) -Working with hematology to confirm alpha-thal dx -B12: pt not taking, encouraged to start -No further iron  infusions, pt is not iron  deficient  4. History of breast cancer -Follow up with [facility] oncology 6/17  Return in about 2 weeks (around 10/10/2023) for ROB.   Future Appointments  Date Time Provider Department Center  10/10/2023  3:55 PM Forestine Igo, CNM [facility] None   For next visit:  continue with routine prenatal care    Sofia Dunn, DO [facility]

## 2023-10-08 LAB — ALPHA-THALASSEMIA ANALYSIS: IMAGE: 0

## 2023-10-08 LAB — ALPHA-THALASSEMIA GENOTYPR

## 2023-10-10 ENCOUNTER — Encounter: Payer: Self-pay | Admitting: Certified Nurse Midwife

## 2023-10-10 ENCOUNTER — Ambulatory Visit (INDEPENDENT_AMBULATORY_CARE_PROVIDER_SITE_OTHER): Payer: PRIVATE HEALTH INSURANCE | Admitting: Certified Nurse Midwife

## 2023-10-10 ENCOUNTER — Other Ambulatory Visit (HOSPITAL_COMMUNITY)
Admission: RE | Admit: 2023-10-10 | Discharge: 2023-10-10 | Disposition: A | Discharge status: Home / Self Care | Payer: PRIVATE HEALTH INSURANCE | Source: Ambulatory Visit | Attending: Certified Nurse Midwife | Admitting: Certified Nurse Midwife

## 2023-10-10 VITALS — BP 108/64 | HR 102 | Wt 158.0 lb

## 2023-10-10 DIAGNOSIS — Z113 Encounter for screening for infections with a predominantly sexual mode of transmission: Secondary | ICD-10-CM | POA: Insufficient documentation

## 2023-10-10 DIAGNOSIS — Z3A35 35 weeks gestation of pregnancy: Secondary | ICD-10-CM | POA: Diagnosis not present

## 2023-10-10 DIAGNOSIS — Z3480 Encounter for supervision of other normal pregnancy, unspecified trimester: Secondary | ICD-10-CM | POA: Diagnosis present

## 2023-10-10 DIAGNOSIS — Z3483 Encounter for supervision of other normal pregnancy, third trimester: Secondary | ICD-10-CM

## 2023-10-10 DIAGNOSIS — Z3685 Encounter for antenatal screening for Streptococcus B: Secondary | ICD-10-CM

## 2023-10-10 NOTE — Patient Instructions (Signed)
 Third Trimester of Pregnancy  The third trimester of pregnancy is from week 28 through week 40. This is months 7 through 9. The third trimester is a time when your baby is growing fast. Body changes during your third trimester Your body continues to change during this time. The changes usually go away after your baby is born. Physical changes You will continue to gain weight. You may get stretch marks on your hips, belly, and breasts. Your breasts will keep growing and may hurt. A yellow fluid (colostrum) may leak from your breasts. This is the first milk you're making for your baby. Your hair may grow faster and get thicker. In some cases, you may get hair loss. Your belly button may stick out. You may have more swelling in your hands, face, or ankles. Health changes You may have heartburn. You may feel short of breath. This is caused by the uterus that is now bigger. You may have more aches in the pelvis, back, or thighs. You may have more tingling or numbness in your hands, arms, and legs. You may pee more often. You may have trouble pooping (constipation) or swollen veins in the butt that can itch or get painful (hemorrhoids). Other changes You may have more problems sleeping. You may notice the baby moving lower in your belly (dropping). You may have more fluid coming from your vagina. Your joints may feel loose, and you may have pain around your pelvic bone. Follow these instructions at home: Medicines Take medicines only as told by your health care provider. Some medicines are not safe during pregnancy. Your provider may change the medicines that you take. Do not take any medicines unless told to by your provider. Take a prenatal vitamin that has at least 600 micrograms (mcg) of folic acid. Do not use herbal medicines, illegal drugs, or medicines that are not approved by your provider. Eating and drinking While you're pregnant your body needs additional nutrition to help  support your growing baby. Talk with your provider about your nutritional needs. Activity Most women are able to exercise regularly during pregnancy. Exercise routines may need to change at the end of your pregnancy. Talk to your provider about your activities and exercise routine. Relieving pain and discomfort Rest often with your legs raised if you have leg cramps or low back pain. Take warm sitz baths to soothe pain from hemorrhoids. Use hemorrhoid cream if your provider says it's okay. Wear a good, supportive bra if your breasts hurt. Do not use hot tubs, steam rooms, or saunas. Do not douche. Do not use tampons or scented pads. Safety Talk to your provider before traveling far distances. Wear your seatbelt at all times when you're in a car. Talk to your provider if someone hits you, hurts you, or yells at you. Preparing for birth To prepare for your baby: Take childbirth and breastfeeding classes. Visit the hospital and tour the maternity area. Buy a rear-facing car seat. Learn how to install it in your car. General instructions Avoid cat litter boxes and soil used by cats. These things carry germs that can cause harm to your pregnancy and your baby. Do not drink alcohol, smoke, vape, or use products with nicotine or tobacco in them. If you need help quitting, talk with your provider. Keep all follow-up visits for your third trimester. Your provider will do more exams and tests during this trimester. Write down your questions. Take them to your prenatal visits. Your provider also will: Talk with you about  your overall health. Give you advice or refer you to specialists who can help with different needs, including: Mental health and counseling. Foods and healthy eating. Ask for help if you need help with food. Where to find more information American Pregnancy Association: americanpregnancy.org [employer] of Obstetricians and Gynecologists: acog.org Office on [employer] Health:  [employer] Contact a health care provider if: You have a headache that does not go away when you take medicine. You have any of these problems: You can't eat or drink. You have nausea and vomiting. You have watery poop (diarrhea) for 2 days or more. You have pain when you pee, or your pee smells bad. You have been sick for 2 days or more and aren't getting better. Contact your provider right away if: You have any of these coming from your vagina: Abnormal discharge. Bad-smelling fluid. Bleeding. Your baby is moving less than usual. You have signs of labor: You have any contractions, belly cramping, or have pain in your pelvis or lower back before 37 weeks of pregnancy (preterm labor). You have regular contractions that are less than 5 minutes apart. Your water breaks. You have symptoms of high blood pressure or preeclampsia. These include: A severe, throbbing headache that does not go away. Sudden or extreme swelling of your face, hands, legs, or feet. Vision problems: You see spots. You have blurry vision. Your eyes are sensitive to light. If you can't reach your provider, go to an urgent care or emergency room. Get help right away if: You faint, become confused, or can't think clearly. You have chest pain or trouble breathing. You have any kind of injury, such as from a fall or a car crash. These symptoms may be an emergency. Call 911 right away. Do not wait to see if the symptoms will go away. Do not drive yourself to the hospital. This information is not intended to replace advice given to you by your health care provider. Make sure you discuss any questions you have with your health care provider. Document Revised: 01/09/2023 Document Reviewed: 08/09/2022 Elsevier Patient Education  2024 [employer].

## 2023-10-10 NOTE — Progress Notes (Signed)
    Return Prenatal Note   Subjective   33 y.o. [id number] at [redacted]w[redacted]d presents for this follow-up prenatal visit.  Patient feeling well, active baby. Saw oncologist this week & follow ups now every 6 months! Patient reports: Movement: Present Contractions: Not present  Objective   Flow sheet Vitals: Pulse Rate: (!) 102 BP: 108/64 Fundal Height: 36 cm Fetal Heart Rate (bpm): 135 Presentation: Vertex (confirmed on BSUS) Total weight gain: 33 lb (15 kg)  General Appearance  No acute distress, well appearing, and well nourished Pulmonary   Normal work of breathing Neurologic   Alert and oriented to person, place, and time Psychiatric   Mood and affect within normal limits  Assessment/Plan   Plan  33 y.o. [id number] at [redacted]w[redacted]d presents for follow-up OB visit. Reviewed prenatal record including previous visit note.  Supervision of other normal pregnancy, antepartum Reviewed kick counts and preterm labor warning signs. Instructed to call office or come to hospital with persistent headache, vision changes, regular contractions, leaking of fluid, decreased fetal movement or vaginal bleeding.      Orders Placed This Encounter  Procedures   Culture, beta strep (group b only)   Return in 1 week (on 10/17/2023) for ROB.   Future Appointments  Date Time Provider Department Center  10/16/2023  4:15 PM Phylliss Brenner, CNM [facility] None  10/22/2023  4:15 PM Phylliss Brenner, CNM [facility] None  11/07/2023  3:55 PM Forestine Igo, CNM [facility] None  11/21/2023  3:55 PM Phylliss Brenner, CNM [facility] None    For next visit:  continue with routine prenatal care     Forestine Igo, CNM  06/20/255:10 PM

## 2023-10-10 NOTE — Assessment & Plan Note (Signed)
 Reviewed kick counts and preterm labor warning signs. Instructed to call office or come to hospital with persistent headache, vision changes, regular contractions, leaking of fluid, decreased fetal movement or vaginal bleeding.

## 2023-10-13 ENCOUNTER — Ambulatory Visit: Payer: Self-pay | Admitting: Certified Nurse Midwife

## 2023-10-13 LAB — CULTURE, BETA STREP (GROUP B ONLY): Strep Gp B Culture: POSITIVE — AB

## 2023-10-14 ENCOUNTER — Ambulatory Visit: Payer: PRIVATE HEALTH INSURANCE | Admitting: Oncology

## 2023-10-14 ENCOUNTER — Inpatient Hospital Stay: Payer: PRIVATE HEALTH INSURANCE

## 2023-10-14 ENCOUNTER — Encounter: Payer: Self-pay | Admitting: Oncology

## 2023-10-14 ENCOUNTER — Inpatient Hospital Stay (HOSPITAL_BASED_OUTPATIENT_CLINIC_OR_DEPARTMENT_OTHER): Payer: PRIVATE HEALTH INSURANCE | Admitting: Oncology

## 2023-10-14 VITALS — BP 110/71 | HR 110 | Temp 97.1°F | Resp 18 | Wt 157.8 lb

## 2023-10-14 DIAGNOSIS — D509 Iron deficiency anemia, unspecified: Secondary | ICD-10-CM | POA: Diagnosis not present

## 2023-10-14 DIAGNOSIS — D563 Thalassemia minor: Secondary | ICD-10-CM | POA: Diagnosis not present

## 2023-10-14 DIAGNOSIS — E538 Deficiency of other specified B group vitamins: Secondary | ICD-10-CM

## 2023-10-14 DIAGNOSIS — O99019 Anemia complicating pregnancy, unspecified trimester: Secondary | ICD-10-CM

## 2023-10-14 DIAGNOSIS — Z331 Pregnant state, incidental: Secondary | ICD-10-CM

## 2023-10-14 LAB — CERVICOVAGINAL ANCILLARY ONLY
Chlamydia: NEGATIVE
Comment: NEGATIVE
Comment: NORMAL
Neisseria Gonorrhea: NEGATIVE

## 2023-10-14 MED ORDER — CYANOCOBALAMIN 1000 MCG/ML IJ SOLN
1000.0000 ug | Freq: Once | INTRAMUSCULAR | Status: AC
  Administered 2023-10-14: 1000 ug via INTRAMUSCULAR
  Filled 2023-10-14: qty 1

## 2023-10-14 NOTE — Progress Notes (Signed)
 Hematology/Oncology Progress note [phone] [phone]           REFERRING PROVIDER: Eleanora Burnard Brier*   CHIEF COMPLAINTS/REASON FOR VISIT:  Evaluation of anemia due to pregnancy.   ASSESSMENT & PLAN:   Anemia during pregnancy Reviewed previous lab results. 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  No baseline iron  levels were checked prior to IV Venofer  treatments.  I will hold off additional Venofer  treatments. Recommend patient to repeat labs in 2 weeks, cbc iron  tibc ferritin, hold tube If hemoglobin further drops, consider blood transfusion.    Alpha thalassemia trait Discussed with patient that Alpha thalassemia trait is a genetic condition where a person has one or two mutated genes for alpha globin, leading to mild anemia but usually no serious health issues. No intervention needed. During pregnancy, her baseline hemoglobin may further decrease due to gestation anemia.  Parents with alpha thalassemia trait can pass it on to their children, so I suggest that her family members undergo screening and consider genetic counseling if they are planning to start a family.  B12 deficiency B12 level is less than 400.   Recommend B12 1000mcg injections weekly x 4.    Orders Placed This Encounter  Procedures   CBC with Differential (Cancer Center Only)    Standing Status:   Future    Expected Date:   01/14/2024    Expiration Date:   04/13/2024   Vitamin B12    Standing Status:   Future    Expected Date:   01/14/2024    Expiration Date:   04/13/2024   CBC with Differential (Cancer Center Only)    Standing Status:   Future    Expected Date:   10/28/2023    Expiration Date:   01/26/2024   Iron  and TIBC    Standing Status:   Future    Expected Date:    10/28/2023    Expiration Date:   01/26/2024   Ferritin    Standing Status:   Future    Expected Date:   10/28/2023    Expiration Date:   01/26/2024   Sample to Blood Bank    Standing Status:   Future    Expected Date:   01/14/2024    Expiration Date:   04/13/2024   Sample to Blood Bank    Standing Status:   Future    Expected Date:   10/28/2023    Expiration Date:   01/26/2024   Follow-up to be determined. All questions were answered. The patient knows to call the clinic with any problems, questions or concerns.  [name], MD, PhD [facility] Hematology Oncology 10/14/2023   HISTORY OF PRESENTING ILLNESS:   Angel Pacheco is a  33 y.o.  female with PMH listed below was seen in consultation at the request of  Eleanora Burnard Brier*  for evaluation of anemia due to pregnancy.  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  Patient had neoadjuvant AC during pregnancy, she delivered her baby in March 2023, and finished carboplatin/Taxol/pembrolizumab neoadjuvantly. Patient underwent mastectomy in July 2023.  She received adjuvant radiation, adjuvant Keytruda.  She has residual disease after neoadjuvant therapy.  She also received adjuvant Xeloda after she finishes radiation.  Xeloda was finished  in May 2024. There was concern of questionable lung nodules, suspicious for metastasis.  Biopsy was not conclusive.  Her oncology care has been with [facility] Dr. Eleanora  - Patient reports a history of alpha thalassemia trait.  Review her previous medical history, her baseline hemoglobin is about lowe to Mid 10s Her hemoglobin on 12/22/2021 was 10.  At that time, she has finished Xeloda and hemoglobin has resolved to close to baseline.  - Currently patient is pregnant, she is in third trimester. Hemoglobin has trended down to 8.1 on recent blood work.  No recent iron  panel was checked.  She was  given 3 daily dose of IV Venofer  empirically by her midwife Slaughter.  There was no improvement of hemoglobin and patient was referred to establish care with me.  Today patient reports feeling well.  She reports feeling well at baseline.  She denies shortness of breath, palpitation, chest pain, lightheadedness.  She works in a Psychiatric nurse.  Patient has chronic tachycardia, mild pericardial effusion  INTERVAL HISTORY Angel Pacheco is a 33 y.o. female who has above history reviewed by me today presents for follow up visit for iron  deficiency anemia.  She presents to discuss results.    MEDICAL HISTORY:  Past Medical History:  Diagnosis Date   Allergic rhinitis    Anemia    Breast cancer (HCC)    Raynaud phenomenon    Thalassemia     SURGICAL HISTORY: Past Surgical History:  Procedure Laterality Date   NO PAST SURGERIES      SOCIAL HISTORY: Social History   Socioeconomic History   Marital status: Married    Spouse name: Denzel   Number of children: 3   Years of education: 13   Highest education level: Some college, no degree  Occupational History   Occupation: Audiological scientist  Tobacco Use   Smoking status: Never    Passive exposure: Current   Smokeless tobacco: Never  Vaping Use   Vaping status: Never Used  Substance and Sexual Activity   Alcohol use: No   Drug use: No   Sexual activity: Yes    Partners: Male    Birth control/protection: None  Other Topics Concern   Not on file  Social History Narrative   Not on file   Social Drivers of Health   Financial Resource Strain: Not on file  Food Insecurity: No Food Insecurity (09/23/2023)   Hunger Vital Sign    Worried About Running Out of Food in the Last Year: Never true    Ran Out of Food in the Last Year: Never true  Transportation Needs: No Transportation Needs (06/23/2023)   PRAPARE - Administrator, Civil Service (Medical): No    Lack of Transportation (Non-Medical): No  Physical Activity:  Not on file  Stress: No Stress Concern Present (09/23/2023)   [employer] of Occupational Health - Occupational Stress Questionnaire    Feeling of Stress : Not at all  Social Connections: Not on file  Intimate Partner Violence: Not At Risk (06/23/2023)   Humiliation, Afraid, Rape, and Kick questionnaire    Fear of Current or Ex-Partner: No    Emotionally Abused: No    Physically Abused: No    Sexually Abused: No    FAMILY HISTORY: Family History  Problem Relation Age of Onset   Diabetes Mother    Stroke Father    Gestational diabetes Sister    Asthma Maternal Grandmother    Breast cancer Maternal Aunt  great aunts   Colon cancer Neg Hx     ALLERGIES:  has no known allergies.  MEDICATIONS:  Current Outpatient Medications  Medication Sig Dispense Refill   ferrous sulfate  325 (65 FE) MG tablet Take 1 tablet (325 mg total) by mouth daily. 60 tablet 3   Prenatal Vit-Fe Fumarate-FA (MULTIVITAMIN-PRENATAL) 27-0.8 MG TABS tablet Take 1 tablet by mouth daily at 12 noon.     No current facility-administered medications for this visit.    Review of Systems  Constitutional:  Negative for appetite change, chills, fatigue and fever.  HENT:   Negative for hearing loss and voice change.   Eyes:  Negative for eye problems.  Respiratory:  Negative for chest tightness and cough.   Cardiovascular:  Negative for chest pain.  Gastrointestinal:  Negative for abdominal distention, abdominal pain and blood in stool.  Endocrine: Negative for hot flashes.  Genitourinary:  Negative for difficulty urinating and frequency.   Musculoskeletal:  Negative for arthralgias.  Skin:  Negative for itching and rash.  Neurological:  Negative for extremity weakness.  Hematological:  Negative for adenopathy.  Psychiatric/Behavioral:  Negative for confusion.    PHYSICAL EXAMINATION: ECOG PERFORMANCE STATUS: 0 - Asymptomatic Vitals:   10/14/23 1446  BP: 110/71  Pulse: (!) 110  Resp: 18  Temp:  (!) 97.1 F (36.2 C)  SpO2: 100%   Filed Weights   10/14/23 1446  Weight: 157 lb 12.8 oz (71.6 kg)    Physical Exam Constitutional:      General: She is not in acute distress. HENT:     Head: Normocephalic and atraumatic.   Eyes:     General: No scleral icterus.   Cardiovascular:     Rate and Rhythm: Normal rate and regular rhythm.     Heart sounds: Normal heart sounds.  Pulmonary:     Effort: Pulmonary effort is normal. No respiratory distress.     Breath sounds: No wheezing.  Abdominal:     Comments: Gravid uterus   Musculoskeletal:        General: No deformity. Normal range of motion.     Cervical back: Normal range of motion and neck supple.   Skin:    General: Skin is warm and dry.     Findings: No erythema or rash.   Neurological:     Mental Status: She is alert and oriented to person, place, and time. Mental status is at baseline.     Cranial Nerves: No cranial nerve deficit.     Coordination: Coordination normal.   Psychiatric:        Mood and Affect: Mood normal.     LABORATORY DATA:  I have reviewed the data as listed    Latest Ref Rng & Units 09/23/2023    3:57 PM 09/12/2023    6:12 PM 09/05/2023    2:26 PM  CBC  WBC 4.0 - 10.5 K/uL 5.5  5.8  5.9   Hemoglobin 12.0 - 15.0 g/dL 8.0  8.1  8.2   Hematocrit 36.0 - 46.0 % 25.5  25.5  25.3   Platelets 150 - 400 K/uL 268  268  294       Latest Ref Rng & Units 09/23/2023    3:58 PM 07/02/2023    2:50 PM 12/22/2021    9:30 PM  CMP  Glucose 70 - 99 mg/dL 79  85  92   BUN 6 - 20 mg/dL 11  15  17    Creatinine 0.44 - 1.00 mg/dL 9.55  0.76  0.87   Sodium 135 - 145 mmol/L 136  138  141   Potassium 3.5 - 5.1 mmol/L 3.2  4.2  3.4   Chloride 98 - 111 mmol/L 109  101  109   CO2 22 - 32 mmol/L 18  19  23    Calcium 8.9 - 10.3 mg/dL 8.6  9.8  89.9   Total Protein 6.5 - 8.1 g/dL 6.7  7.1  8.6   Total Bilirubin 0.0 - 1.2 mg/dL 0.4  <9.7  0.4   Alkaline Phos 38 - 126 U/L 103  47  47   AST 15 - 41 U/L 18  19  21     ALT 0 - 44 U/L 15  17  18        RADIOGRAPHIC STUDIES: I have personally reviewed the radiological images as listed and agreed with the findings in the report. No results found.

## 2023-10-14 NOTE — Assessment & Plan Note (Addendum)
 Reviewed previous lab results. 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  No baseline iron  levels were checked prior to IV Venofer  treatments.  I will hold off additional Venofer  treatments. Recommend patient to repeat labs in 2 weeks, cbc iron  tibc ferritin, hold tube If hemoglobin further drops, consider blood transfusion.

## 2023-10-14 NOTE — Assessment & Plan Note (Signed)
 B12 level is less than 400.   Recommend B12 1000mcg injections weekly x 4.

## 2023-10-14 NOTE — Assessment & Plan Note (Signed)
 Discussed with patient that Alpha thalassemia trait is a genetic condition where a person has one or two mutated genes for alpha globin, leading to mild anemia but usually no serious health issues. No intervention needed. During pregnancy, her baseline hemoglobin may further decrease due to gestation anemia.  Parents with alpha thalassemia trait can pass it on to their children, so I suggest that her family members undergo screening and consider genetic counseling if they are planning to start a family.

## 2023-10-16 ENCOUNTER — Encounter: Payer: Self-pay | Admitting: Obstetrics

## 2023-10-16 ENCOUNTER — Ambulatory Visit (INDEPENDENT_AMBULATORY_CARE_PROVIDER_SITE_OTHER): Payer: PRIVATE HEALTH INSURANCE | Admitting: Obstetrics

## 2023-10-16 VITALS — BP 101/64 | HR 101 | Wt 159.0 lb

## 2023-10-16 DIAGNOSIS — E538 Deficiency of other specified B group vitamins: Secondary | ICD-10-CM | POA: Diagnosis not present

## 2023-10-16 DIAGNOSIS — O99283 Endocrine, nutritional and metabolic diseases complicating pregnancy, third trimester: Secondary | ICD-10-CM | POA: Diagnosis not present

## 2023-10-16 DIAGNOSIS — D649 Anemia, unspecified: Secondary | ICD-10-CM

## 2023-10-16 DIAGNOSIS — Z3A36 36 weeks gestation of pregnancy: Secondary | ICD-10-CM

## 2023-10-16 DIAGNOSIS — Z3480 Encounter for supervision of other normal pregnancy, unspecified trimester: Secondary | ICD-10-CM

## 2023-10-16 DIAGNOSIS — O99019 Anemia complicating pregnancy, unspecified trimester: Secondary | ICD-10-CM | POA: Diagnosis not present

## 2023-10-16 NOTE — Progress Notes (Signed)
    Return Prenatal Note   Assessment/Plan   Plan  33 y.o. [id number] at [redacted]w[redacted]d presents for follow-up OB visit. Reviewed prenatal record including previous visit note.  B12 deficiency -Receiving B12 injections  Anemia during pregnancy -No further iron  infusions d/t alpha-thalassemia   Supervision of other normal pregnancy, antepartum -Cephalic presentation by BSUS -Encouraging timely labor handout sent -Reviewed labor warning signs. Instructed to [place] office or come to hospital with persistent headache, vision changes, regular contractions, leaking of fluid, decreased fetal movement or vaginal bleeding.     No orders of the defined types were placed in this encounter.  Return in about 1 week (around 10/23/2023).   Future Appointments  Date Time Provider Department Center  10/21/2023  3:00 PM [facility] INJECTION [facility] None  10/22/2023  4:15 PM Angel Pacheco, CNM [facility] None  10/28/2023  3:00 PM [facility]JE[facility] None  10/28/2023  3:15 PM [facility] LAB [facility] None  11/04/2023  3:00 PM [facility]JE[facility] None  11/07/2023  3:55 PM Angel Pacheco, CNM [facility] None  11/21/2023  3:55 PM Angel Pacheco, CNM [facility] None  01/14/2024  2:30 PM [facility] None  01/14/2024  2:45 PM Angel Call, MD [facility] None    For next visit:  Routine prenatal care    Subjective   Angel Pacheco is feeling well and has no concerns today. She feels a little less fatigued after receiving a B12 injection. She is excited to meet this feisty baby!  Movement: Present Contractions: Irritability  Objective   Flow sheet Vitals: Pulse Rate: (!) 101 BP: 101/64 Fundal Height: 37 cm Fetal Heart Rate (bpm): 158 Presentation: Vertex Total weight gain: 34 lb (15.4 kg)  General Appearance  No acute distress, well appearing, and well nourished Pulmonary   Normal work of breathing Neurologic   Alert and oriented to person, place, and time Psychiatric   Mood and affect within normal  limits  Setter Angel, CNM 10/16/23 4:24 PM

## 2023-10-16 NOTE — Assessment & Plan Note (Signed)
-  No further iron  infusions d/t alpha-thalassemia

## 2023-10-16 NOTE — Assessment & Plan Note (Signed)
-  Cephalic presentation by [facility] -Encouraging timely labor handout sent -Reviewed labor warning signs. Instructed to call office or come to hospital with persistent headache, vision changes, regular contractions, leaking of fluid, decreased fetal movement or vaginal bleeding.

## 2023-10-16 NOTE — Assessment & Plan Note (Signed)
Receiving B12 injections.  °

## 2023-10-21 ENCOUNTER — Inpatient Hospital Stay: Payer: PRIVATE HEALTH INSURANCE | Attending: Oncology

## 2023-10-21 DIAGNOSIS — D509 Iron deficiency anemia, unspecified: Secondary | ICD-10-CM | POA: Insufficient documentation

## 2023-10-21 DIAGNOSIS — E538 Deficiency of other specified B group vitamins: Secondary | ICD-10-CM | POA: Insufficient documentation

## 2023-10-21 DIAGNOSIS — Z79899 Other long term (current) drug therapy: Secondary | ICD-10-CM | POA: Diagnosis not present

## 2023-10-21 MED ORDER — CYANOCOBALAMIN 1000 MCG/ML IJ SOLN
1000.0000 ug | Freq: Once | INTRAMUSCULAR | Status: AC
  Administered 2023-10-21: 1000 ug via INTRAMUSCULAR
  Filled 2023-10-21: qty 1

## 2023-10-22 ENCOUNTER — Ambulatory Visit (INDEPENDENT_AMBULATORY_CARE_PROVIDER_SITE_OTHER): Payer: PRIVATE HEALTH INSURANCE | Admitting: Obstetrics

## 2023-10-22 VITALS — BP 112/71 | HR 108 | Wt 159.0 lb

## 2023-10-22 DIAGNOSIS — E538 Deficiency of other specified B group vitamins: Secondary | ICD-10-CM | POA: Diagnosis not present

## 2023-10-22 DIAGNOSIS — Z9011 Acquired absence of right breast and nipple: Secondary | ICD-10-CM

## 2023-10-22 DIAGNOSIS — Z853 Personal history of malignant neoplasm of breast: Secondary | ICD-10-CM | POA: Diagnosis not present

## 2023-10-22 DIAGNOSIS — Z3480 Encounter for supervision of other normal pregnancy, unspecified trimester: Secondary | ICD-10-CM

## 2023-10-22 NOTE — Assessment & Plan Note (Signed)
-   Anticipatory guidance reviewed. - Reviewed labor warning signs. Instructed to call office or come to hospital with persistent headache, vision changes, regular contractions, leaking of fluid, decreased fetal movement or vaginal bleeding.

## 2023-10-22 NOTE — Progress Notes (Signed)
    Return Prenatal Note   Assessment/Plan   Plan  33 y.o. [phone] at [redacted]w[redacted]d presents for follow-up OB visit. Reviewed prenatal record including previous visit note.  Supervision of other normal pregnancy, antepartum - Anticipatory guidance reviewed.  - Reviewed labor warning signs. Instructed to [place] office or come to hospital with persistent headache, vision changes, regular contractions, leaking of fluid, decreased fetal movement or vaginal bleeding.      No orders of the defined types were placed in this encounter.  No follow-ups on file.   Future Appointments  Date Time Provider Department Center  10/28/2023  3:00 PM [facility]JE[facility] None  10/28/2023  3:15 PM [facility] LAB [facility] None  11/04/2023  3:00 PM [facility]JE[facility] None  11/07/2023  3:55 PM Angel Pacheco, CNM [facility] None  11/21/2023  3:55 PM Angel Pacheco, CNM [facility] None  01/14/2024  2:30 PM [facility] LAB [facility] None  01/14/2024  2:45 PM Angel Call, MD [facility] None    For next visit:  Routine prenatal care    Subjective  Angel Pacheco is feeling well overall, she is ready for baby! She has been having some cramping and a few contractions. Denies LOF, VB.   Movement: Present Contractions: Irritability  Objective   Flow sheet Vitals: Pulse Rate: (!) 108 BP: 112/71 Fundal Height: 37 cm Fetal Heart Rate (bpm): 158 Total weight gain: 15.4 kg  General Appearance  No acute distress, well appearing, and well nourished Pulmonary   Normal work of breathing Neurologic   Alert and oriented to person, place, and time Psychiatric   Mood and affect within normal limits  Angel Pacheco, [facility] 10/22/23 4:25 PM

## 2023-10-28 ENCOUNTER — Inpatient Hospital Stay: Payer: PRIVATE HEALTH INSURANCE

## 2023-10-28 ENCOUNTER — Other Ambulatory Visit: Payer: Self-pay

## 2023-10-28 DIAGNOSIS — D509 Iron deficiency anemia, unspecified: Secondary | ICD-10-CM | POA: Diagnosis not present

## 2023-10-28 DIAGNOSIS — E538 Deficiency of other specified B group vitamins: Secondary | ICD-10-CM

## 2023-10-28 DIAGNOSIS — O99019 Anemia complicating pregnancy, unspecified trimester: Secondary | ICD-10-CM

## 2023-10-28 DIAGNOSIS — Z853 Personal history of malignant neoplasm of breast: Secondary | ICD-10-CM

## 2023-10-28 LAB — CBC WITH DIFFERENTIAL (CANCER CENTER ONLY)
Abs Immature Granulocytes: 0.02 K/uL (ref 0.00–0.07)
Basophils Absolute: 0 K/uL (ref 0.0–0.1)
Basophils Relative: 0 %
Eosinophils Absolute: 0.1 K/uL (ref 0.0–0.5)
Eosinophils Relative: 2 %
HCT: 26.5 % — ABNORMAL LOW (ref 36.0–46.0)
Hemoglobin: 8.3 g/dL — ABNORMAL LOW (ref 12.0–15.0)
Immature Granulocytes: 0 %
Lymphocytes Relative: 21 %
Lymphs Abs: 1.2 K/uL (ref 0.7–4.0)
MCH: 23.3 pg — ABNORMAL LOW (ref 26.0–34.0)
MCHC: 31.3 g/dL (ref 30.0–36.0)
MCV: 74.4 fL — ABNORMAL LOW (ref 80.0–100.0)
Monocytes Absolute: 0.4 K/uL (ref 0.1–1.0)
Monocytes Relative: 7 %
Neutro Abs: 3.9 K/uL (ref 1.7–7.7)
Neutrophils Relative %: 70 %
Platelet Count: 254 K/uL (ref 150–400)
RBC: 3.56 MIL/uL — ABNORMAL LOW (ref 3.87–5.11)
RDW: 15.1 % (ref 11.5–15.5)
WBC Count: 5.6 K/uL (ref 4.0–10.5)
nRBC: 0 % (ref 0.0–0.2)

## 2023-10-28 LAB — IRON AND TIBC
Iron: 118 ug/dL (ref 28–170)
Saturation Ratios: 28 % (ref 10.4–31.8)
TIBC: 419 ug/dL (ref 250–450)
UIBC: 301 ug/dL

## 2023-10-28 LAB — ABO/RH: ABO/RH(D): B POS

## 2023-10-28 LAB — SAMPLE TO BLOOD BANK

## 2023-10-28 LAB — FERRITIN: Ferritin: 165 ng/mL (ref 11–307)

## 2023-10-28 MED ORDER — CYANOCOBALAMIN 1000 MCG/ML IJ SOLN
1000.0000 ug | Freq: Once | INTRAMUSCULAR | Status: AC
  Administered 2023-10-28: 1000 ug via INTRAMUSCULAR
  Filled 2023-10-28: qty 1

## 2023-10-29 ENCOUNTER — Observation Stay
Admission: EM | Admit: 2023-10-29 | Discharge: 2023-10-29 | Disposition: A | Discharge status: Home / Self Care | Payer: PRIVATE HEALTH INSURANCE | Attending: Obstetrics | Admitting: Obstetrics

## 2023-10-29 DIAGNOSIS — O471 False labor at or after 37 completed weeks of gestation: Principal | ICD-10-CM | POA: Insufficient documentation

## 2023-10-29 DIAGNOSIS — Z3A38 38 weeks gestation of pregnancy: Secondary | ICD-10-CM | POA: Diagnosis not present

## 2023-10-29 MED ORDER — ACETAMINOPHEN 325 MG PO TABS: 650.0000 mg | ORAL_TABLET | ORAL | Status: DC | PRN

## 2023-10-29 MED ORDER — LACTATED RINGERS IV SOLN: 500.0000 mL | INTRAVENOUS | Status: DC | PRN

## 2023-10-29 MED ORDER — SOD CITRATE-CITRIC ACID 500-334 MG/5ML PO SOLN: 30.0000 mL | ORAL | Status: DC | PRN

## 2023-10-29 MED ORDER — ONDANSETRON HCL 4 MG/2ML IJ SOLN: 4.0000 mg | Freq: Four times a day (QID) | INTRAMUSCULAR | Status: DC | PRN

## 2023-10-29 NOTE — OB Triage Note (Signed)
 Pt arrives via triage with c/o ctx's which started at 1730. Pt is in NAD at this time.

## 2023-10-29 NOTE — OB Triage Provider Note (Signed)
 LABOR & DELIVERY OB TRIAGE NOTE  SUBJECTIVE  HPI Angel Pacheco is a 33 y.o. [id number] at [redacted]w[redacted]d who presented to Labor & Delivery for contractions that started around 1630. She denies LOF and vaginal bleeding. She endorses good fetal movement.   OB History     Gravida  4   Para  3   Term  2   Preterm  1   AB      Living  3      SAB      IAB      Ectopic      Multiple      Live Births  3           Scheduled Meds: Continuous Infusions:  lactated ringers      PRN Meds:.acetaminophen , lactated ringers , ondansetron , sodium citrate-citric acid   OBJECTIVE  BP 119/69 (BP Location: Right Arm)   Pulse (!) 107   Temp 98 F (36.7 C) (Oral)   Resp 18   Ht 5' 1 (1.549 m)   Wt 72.1 kg   LMP 02/05/2023 (Exact Date)   BMI 30.04 kg/m   Abdomen: soft, gravid, non-tender Cervical exam: Dilation: 1 Effacement (%): 50 Station: -3 Presentation: Vertex Exam by:: Janisa Labus, CNM  Cervix remained unchanged over 2 hours in triage  NST I reviewed the NST and it was reactive.  Baseline: 135 Variability: moderate Accelerations: present Decelerations:none Toco: q 2-3 min Category 1  ASSESSMENT Impression  1) Pregnancy at [id number], [redacted]w[redacted]d, Estimated Date of Delivery: 11/12/23 2) Reassuring maternal/fetal status  PLAN 1) Discharge home with standard labor/return precautions. Encouraged rest, hydration, Tylenol  PM, warm bath 2) Keep scheduled ROB appts  Eleanor Canny, CNM 10/29/23  8:06 PM

## 2023-11-04 ENCOUNTER — Inpatient Hospital Stay: Payer: PRIVATE HEALTH INSURANCE

## 2023-11-06 DIAGNOSIS — O99820 Streptococcus B carrier state complicating pregnancy: Principal | ICD-10-CM

## 2023-11-06 HISTORY — DX: Streptococcus B carrier state complicating pregnancy: O99.820

## 2023-11-07 ENCOUNTER — Ambulatory Visit (INDEPENDENT_AMBULATORY_CARE_PROVIDER_SITE_OTHER): Payer: PRIVATE HEALTH INSURANCE | Admitting: Certified Nurse Midwife

## 2023-11-07 ENCOUNTER — Inpatient Hospital Stay: Payer: PRIVATE HEALTH INSURANCE

## 2023-11-07 VITALS — BP 115/76 | HR 109 | Wt 157.9 lb

## 2023-11-07 DIAGNOSIS — E538 Deficiency of other specified B group vitamins: Secondary | ICD-10-CM

## 2023-11-07 DIAGNOSIS — Z3480 Encounter for supervision of other normal pregnancy, unspecified trimester: Secondary | ICD-10-CM

## 2023-11-07 DIAGNOSIS — D509 Iron deficiency anemia, unspecified: Secondary | ICD-10-CM | POA: Diagnosis not present

## 2023-11-07 DIAGNOSIS — O99820 Streptococcus B carrier state complicating pregnancy: Secondary | ICD-10-CM

## 2023-11-07 DIAGNOSIS — Z3A39 39 weeks gestation of pregnancy: Secondary | ICD-10-CM

## 2023-11-07 DIAGNOSIS — Z3483 Encounter for supervision of other normal pregnancy, third trimester: Secondary | ICD-10-CM

## 2023-11-07 MED ORDER — CYANOCOBALAMIN 1000 MCG/ML IJ SOLN
1000.0000 ug | Freq: Once | INTRAMUSCULAR | Status: AC
  Administered 2023-11-07: 1000 ug via INTRAMUSCULAR

## 2023-11-07 NOTE — Assessment & Plan Note (Signed)
 Reviewed labor warning signs and expectations for birth. Instructed to call office or come to hospital with persistent headache, vision changes, regular contractions, leaking of fluid, decreased fetal movement or vaginal bleeding. Reviewed small amount of bleeding present after sweep is normal, however if increases recommend triage visit for evaluation.

## 2023-11-07 NOTE — Progress Notes (Signed)
    Return Prenatal Note   Subjective   33 y.o. [id number] at [redacted]w[redacted]d presents for this follow-up prenatal visit.  Patient feeling well, ready for labor. Would like exam & sweep today if possible. Patient reports: Movement: Present Contractions: Irritability  Objective   Flow sheet Vitals: Pulse Rate: (!) 109 BP: 115/76 Fundal Height: 38 cm Fetal Heart Rate (bpm): 140 Presentation: Vertex Dilation: 2 (swept) Effacement (%): 60 Station: -2 Total weight gain: 32 lb 14.4 oz (14.9 kg)  General Appearance  No acute distress, well appearing, and well nourished Pulmonary   Normal work of breathing Neurologic   Alert and oriented to person, place, and time Psychiatric   Mood and affect within normal limits  Assessment/Plan   Plan  33 y.o. [id number] at [redacted]w[redacted]d presents for follow-up OB visit. Reviewed prenatal record including previous visit note.  Supervision of other normal pregnancy, antepartum Reviewed labor warning signs and expectations for birth. Instructed to call office or come to hospital with persistent headache, vision changes, regular contractions, leaking of fluid, decreased fetal movement or vaginal bleeding. Reviewed small amount of bleeding present after sweep is normal, however if increases recommend triage visit for evaluation.      No orders of the defined types were placed in this encounter.  Return in about 1 week (around 11/14/2023) for NST/ROB.   Future Appointments  Date Time Provider Department Center  11/12/2023  2:30 PM [facility] ROOM [facility] None  11/12/2023  2:35 PM Slaughterbeck, Damien, CNM [facility] None  11/21/2023  3:55 PM Justino Eleanor HERO, CNM [facility] None  01/14/2024  2:30 PM [facility] LAB [facility] None  01/14/2024  2:45 PM Babara Call, MD [facility] None    For next visit:  ROB with NST     Harlene LITTIE Cisco, CNM  07/18/254:42 PM

## 2023-11-07 NOTE — Patient Instructions (Addendum)
 Signs and Symptoms of Labor Labor is the body's natural process of moving the baby and the placenta out of the uterus. The process of labor usually starts when the baby is full-term, between 26 and 41 weeks of pregnancy. Signs and symptoms that you are close to going into labor As your body prepares for labor and the birth of your baby, you may notice the following symptoms in the weeks and days before true labor starts: Passing a small amount of thick, bloody mucus from your vagina. This is called normal bloody show or losing your mucus plug. This may happen more than a week before labor begins, or right before labor begins, as the opening of the cervix starts to widen (dilate). For some women, the entire mucus plug passes at once. For others, pieces of the mucus plug may gradually pass over several days. Your baby moving (dropping) lower in your pelvis to get into position for birth (lightening). When this happens, you may feel more pressure on your bladder and pelvic bone and less pressure on your ribs. This may make it easier to breathe. It may also cause you to need to urinate more often and have problems with bowel movements. Having practice contractions, also called Braxton Hicks contractions or false labor. These occur at irregular (unevenly spaced) intervals that are more than 10 minutes apart. False labor contractions are common after exercise or sexual activity. They will stop if you change position, rest, or drink fluids. These contractions are usually mild and do not get stronger over time. They may feel like: A backache or back pain. Mild cramps, similar to menstrual cramps. Tightening or pressure in your abdomen. Other early symptoms include: Nausea or loss of appetite. Diarrhea. Having a sudden burst of energy, or feeling very tired. Mood changes. Having trouble sleeping. Signs and symptoms that labor has begun Signs that you are in labor may include: Having contractions that come  at regular (evenly spaced) intervals and increase in intensity. This may feel like more intense tightening or pressure in your abdomen that moves to your back. Contractions may also feel like rhythmic pain in your upper thighs or back that comes and goes at regular intervals. If you are delivering for the first time, this change in intensity of contractions often occurs at a more gradual pace. If you have given birth before, you may notice a more rapid progression of contraction changes. Feeling pressure in the vaginal area. Your water breaking (rupture of membranes). This is when the sac of fluid that surrounds your baby breaks. Fluid leaking from your vagina may be clear or blood-tinged. Labor usually starts within 24 hours of your water breaking, but it may take longer to begin. Some people may feel a sudden gush of fluid; others may notice repeatedly damp underwear. Follow these instructions at home:  When labor starts, or if your water breaks, call your health care provider or nurse care line. Based on your situation, they will determine when you should go in for an exam. During early labor, you may be able to rest and manage symptoms at home. Some strategies to try at home include: Breathing and relaxation techniques. Taking a warm bath or shower. Listening to music. Using a heating pad on the lower back for pain. If directed, apply heat to the area as often as told by your health care provider. Use the heat source that your health care provider recommends, such as a moist heat pack or a heating pad. Place a  towel between your skin and the heat source. Leave the heat on for 20-30 minutes. Remove the heat if your skin turns bright red. This is especially important if you are unable to feel pain, heat, or cold. You have a greater risk of getting burned. Contact a health care provider if: Your labor has started. Your water breaks. You have nausea, vomiting, or diarrhea. Get help right away  if: You have painful, regular contractions that are 5 minutes apart or less. Labor starts before you are [redacted] weeks along in your pregnancy. You have a fever. You have bright red blood coming from your vagina. You do not feel your baby moving. You have a severe headache with or without vision problems. You have chest pain or shortness of breath. These symptoms may represent a serious problem that is an emergency. Do not wait to see if the symptoms will go away. Get medical help right away. Call your local emergency services (911 in the [place] Do not drive yourself to the hospital. Summary Labor is your body's natural process of moving your baby and the placenta out of your uterus. The process of labor usually starts when your baby is full-term, between 25 and 40 weeks of pregnancy. When labor starts, or if your water breaks, call your health care provider or nurse care line. Based on your situation, they will determine when you should go in for an exam. This information is not intended to replace advice given to you by your health care provider. Make sure you discuss any questions you have with your health care provider. Document Revised: 08/22/2020 Document Reviewed: 08/22/2020 Elsevier Patient Education  2024 [employer]. Third Trimester of Pregnancy  The third trimester of pregnancy is from week 28 through week 40. This is months 7 through 9. The third trimester is a time when your baby is growing fast. Body changes during your third trimester Your body continues to change during this time. The changes usually go away after your baby is born. Physical changes You will continue to gain weight. You may get stretch marks on your hips, belly, and breasts. Your breasts will keep growing and may hurt. A yellow fluid (colostrum) may leak from your breasts. This is the first milk you're making for your baby. Your hair may grow faster and get thicker. In some cases, you may get hair loss. Your  belly button may stick out. You may have more swelling in your hands, face, or ankles. Health changes You may have heartburn. You may feel short of breath. This is caused by the uterus that is now bigger. You may have more aches in the pelvis, back, or thighs. You may have more tingling or numbness in your hands, arms, and legs. You may pee more often. You may have trouble pooping (constipation) or swollen veins in the butt that can itch or get painful (hemorrhoids). Other changes You may have more problems sleeping. You may notice the baby moving lower in your belly (dropping). You may have more fluid coming from your vagina. Your joints may feel loose, and you may have pain around your pelvic bone. Follow these instructions at home: Medicines Take medicines only as told by your health care provider. Some medicines are not safe during pregnancy. Your provider may change the medicines that you take. Do not take any medicines unless told to by your provider. Take a prenatal vitamin that has at least 600 micrograms (mcg) of folic acid. Do not use herbal medicines, illegal drugs,  or medicines that are not approved by your provider. Eating and drinking While you're pregnant your body needs additional nutrition to help support your growing baby. Talk with your provider about your nutritional needs. Activity Most women are able to exercise regularly during pregnancy. Exercise routines may need to change at the end of your pregnancy. Talk to your provider about your activities and exercise routine. Relieving pain and discomfort Rest often with your legs raised if you have leg cramps or low back pain. Take warm sitz baths to soothe pain from hemorrhoids. Use hemorrhoid cream if your provider says it's okay. Wear a good, supportive bra if your breasts hurt. Do not use hot tubs, steam rooms, or saunas. Do not douche. Do not use tampons or scented pads. Safety Talk to your provider before  traveling far distances. Wear your seatbelt at all times when you're in a car. Talk to your provider if someone hits you, hurts you, or yells at you. Preparing for birth To prepare for your baby: Take childbirth and breastfeeding classes. Visit the hospital and tour the maternity area. Buy a rear-facing car seat. Learn how to install it in your car. General instructions Avoid cat litter boxes and soil used by cats. These things carry germs that can cause harm to your pregnancy and your baby. Do not drink alcohol, smoke, vape, or use products with nicotine or tobacco in them. If you need help quitting, talk with your provider. Keep all follow-up visits for your third trimester. Your provider will do more exams and tests during this trimester. Write down your questions. Take them to your prenatal visits. Your provider also will: Talk with you about your overall health. Give you advice or refer you to specialists who can help with different needs, including: Mental health and counseling. Foods and healthy eating. Ask for help if you need help with food. Where to find more information American Pregnancy Association: americanpregnancy.org [employer]: acog.org Office on [employer] Health: [employer] Contact a health care provider if: You have a headache that does not go away when you take medicine. You have any of these problems: You can't eat or drink. You have nausea and vomiting. You have watery poop (diarrhea) for 2 days or more. You have pain when you pee, or your pee smells bad. You have been sick for 2 days or more and aren't getting better. Contact your provider right away if: You have any of these coming from your vagina: Abnormal discharge. Bad-smelling fluid. Bleeding. Your baby is moving less than usual. You have signs of labor: You have any contractions, belly cramping, or have pain in your pelvis or lower back before 37 weeks of  pregnancy (preterm labor). You have regular contractions that are less than 5 minutes apart. Your water breaks. You have symptoms of high blood pressure or preeclampsia. These include: A severe, throbbing headache that does not go away. Sudden or extreme swelling of your face, hands, legs, or feet. Vision problems: You see spots. You have blurry vision. Your eyes are sensitive to light. If you can't reach your provider, go to an urgent care or emergency room. Get help right away if: You faint, become confused, or can't think clearly. You have chest pain or trouble breathing. You have any kind of injury, such as from a fall or a car crash. These symptoms may be an emergency. Call 911 right away. Do not wait to see if the symptoms will go away. Do not drive yourself  to the hospital. This information is not intended to replace advice given to you by your health care provider. Make sure you discuss any questions you have with your health care provider. Document Revised: 01/09/2023 Document Reviewed: 08/09/2022 Elsevier Patient Education  2024 [employer].

## 2023-11-12 ENCOUNTER — Other Ambulatory Visit: Payer: PRIVATE HEALTH INSURANCE

## 2023-11-12 ENCOUNTER — Encounter: Payer: Self-pay | Admitting: Certified Nurse Midwife

## 2023-11-12 ENCOUNTER — Ambulatory Visit: Payer: PRIVATE HEALTH INSURANCE | Admitting: Certified Nurse Midwife

## 2023-11-12 VITALS — BP 120/82 | HR 117 | Wt 159.3 lb

## 2023-11-12 DIAGNOSIS — O480 Post-term pregnancy: Secondary | ICD-10-CM | POA: Diagnosis not present

## 2023-11-12 DIAGNOSIS — Z3A40 40 weeks gestation of pregnancy: Secondary | ICD-10-CM

## 2023-11-12 DIAGNOSIS — Z3480 Encounter for supervision of other normal pregnancy, unspecified trimester: Secondary | ICD-10-CM

## 2023-11-12 DIAGNOSIS — Z0289 Encounter for other administrative examinations: Secondary | ICD-10-CM

## 2023-11-12 NOTE — Assessment & Plan Note (Signed)
 NST today today for postdates. SVE 3/70/-2, midline, swept.  Scheduled postdates IOL for 7/29 at 0800 . Reviewed what to expect including use of misoprostol, Cooks catheter, pitocin and AROM as potential interventions. Reviewed typical timing for inductions.   Reviewed labor warning signs and expectations for birth. Instructed to call office or come to hospital with persistent headache, vision changes, regular contractions, leaking of fluid, decreased fetal movement or vaginal bleeding.

## 2023-11-12 NOTE — Progress Notes (Signed)
    Return Prenatal Note   Subjective   33 y.o. H5E7896 at [redacted]w[redacted]d presents for this follow-up prenatal visit.  Patient is doing well. Has some bleeding. No urinary symptoms and no new concerns today. Patient reports: Movement: Present Contractions: Regular  Objective   Flow sheet Vitals: Pulse Rate: (!) 117 BP: 120/82 Fundal Height: 40 cm Fetal Heart Rate (bpm): 140 Presentation: Vertex Dilation: 3 Effacement (%): 70 Station: -2 Total weight gain: 34 lb 4.8 oz (15.6 kg)  ANASTYN AYARS 07/21/1990 [redacted]w[redacted]d  Fetus A Non-Stress Test Interpretation for 11/12/23  Indication: Post Dates  Fetal Heart Rate A Mode: External Baseline Rate (A): 140 bpm Variability: Moderate Accelerations: 15 x 15 Decelerations: None Scalp Stimulation: Negative Multiple birth?: No  Uterine Activity Mode: Toco Contraction Frequency (min): rare ui Contraction Quality: Mild Resting Time: Adequate  Interpretation (Fetal Testing) Nonstress Test Interpretation: Reactive Overall Impression: Reassuring for gestational age    General Appearance  No acute distress, well appearing, and well nourished Pulmonary   Normal work of breathing Neurologic   Alert and oriented to person, place, and time Psychiatric   Mood and affect within normal limits  Assessment/Plan   Plan  33 y.o. H5E7896 at [redacted]w[redacted]d presents for follow-up OB visit. Reviewed prenatal record including previous visit note.  Supervision of other normal pregnancy, antepartum NST today today for postdates. SVE 3/70/-2, midline, swept.  Scheduled postdates IOL for 7/29 at 0800 . Reviewed what to expect including use of misoprostol, Cooks catheter, pitocin and AROM as potential interventions. Reviewed typical timing for inductions.   Reviewed labor warning signs and expectations for birth. Instructed to call office or come to hospital with persistent headache, vision changes, regular contractions, leaking of fluid, decreased fetal  movement or vaginal bleeding.       No orders of the defined types were placed in this encounter.  No follow-ups on file.   Future Appointments  Date Time Provider Department Center  11/21/2023  3:55 PM Justino Eleanor HERO, CNM [facility] None  01/14/2024  2:30 PM [facility] None  01/14/2024  2:45 PM Babara Call, MD [facility] None    For next visit: IOL next week     Damien Parsley, CNM [facility] 07/23/253:14 PM

## 2023-11-12 NOTE — Patient Instructions (Signed)
 Labor Induction Labor induction is when steps are taken to cause a pregnant woman to begin the labor process. Most women go into labor on their own between 37 weeks and 42 weeks of pregnancy. When this does not happen, or when there is a medical need for labor to begin, steps may be taken to induce, or bring on, labor. Labor induction causes a pregnant woman's uterus to contract. It also causes the cervix to soften (ripen), open (dilate), and thin out. Usually, labor is not induced before 39 weeks of pregnancy unless there is a medical reason to do so. When is labor induction considered? Labor induction may be right for you if: Your pregnancy lasts longer than 41 to 42 weeks. Your placenta is separating from your uterus (placental abruption). You have a rupture of membranes and your labor does not begin. You have health problems, like diabetes or high blood pressure (preeclampsia) during your pregnancy. Your baby has stopped growing or does not have enough amniotic fluid. Before labor induction begins, your health care provider will consider the following factors: Your medical condition and the baby's condition. How many weeks you have been pregnant. How mature the baby's lungs are. The condition of your cervix. The position of the baby. The size of your birth canal. Tell a health care provider about: Any allergies you have. All medicines you are taking, including vitamins, herbs, eye drops, creams, and over-the-counter medicines. Any problems you or your family members have had with anesthetic medicines. Any surgeries you have had. Any blood disorders you have. Any medical conditions you have. What are the risks? Generally, this is a safe procedure. However, problems may occur, including: Failed induction. Changes in fetal heart rate, such as being too high, too low, or irregular (erratic). Infection in the mother or the baby. Increased risk of having a cesarean delivery. Breaking off  (abruption) of the placenta from the uterus. This is rare. Rupture of the uterus. This is very rare. Your baby could fail to get enough blood flow or oxygen. This can be life-threatening. When induction is needed for medical reasons, the benefits generally outweigh the risks. What happens during the procedure? During the procedure, your health care provider will use one of these methods to induce labor: Stripping the membranes. In this method, the amniotic sac tissue is gently separated from the cervix. This causes the following to happen: Your cervix stretches, which in turn causes the release of prostaglandins. Prostaglandins induce labor and cause the uterus to contract. This procedure is often done in an office visit. You will be sent home to wait for contractions to begin. Prostaglandin medicine. This medicine starts contractions and causes the cervix to dilate and ripen. This can be taken by mouth (orally) or by being inserted into the vagina (suppository). Inserting a small, thin tube (catheter) with a balloon into the vagina and then expanding the balloon with water to dilate the cervix. Breaking the water. In this method, a small instrument is used to make a small hole in the amniotic sac. This eventually causes the amniotic sac to break. Contractions should begin within a few hours. Medicine to trigger or strengthen contractions. This medicine is given through an IV that is inserted into a vein in your arm. This procedure may vary among health care providers and hospitals. Where to find more information [employer] of [employer]: www.marchofdimes.org [employer]ians and Gynecologists: www.acog.org Summary Labor induction causes a pregnant woman's uterus to contract. It also causes the cervix  to soften (ripen), open (dilate), and thin out. Labor is usually not induced before 39 weeks of pregnancy unless there is a medical reason to do so. When induction is needed for medical  reasons, the benefits generally outweigh the risks. Talk with your health care provider about which methods of labor induction are right for you. This information is not intended to replace advice given to you by your health care provider. Make sure you discuss any questions you have with your health care provider. Document Revised: 01/20/2020 Document Reviewed: 01/20/2020 Elsevier Patient Education  2024 Else[employer] Inc[employer]Nonstress Test: What to Expect A nonstress test, also called an NST, is done during pregnancy to check your baby's heartbeat. The procedure can help to show if your baby is healthy. It may be done if: Your due date has passed. Your pregnancy is high risk. Your baby is moving less than normal. You've lost a previous pregnancy. Your baby is growing slowly. There's too much or too little fluid around your baby. The NST may be done in the third trimester to find out if it's best for your baby to be born early. During an NST, your baby's heartbeat is watched for at least 20 minutes. If the baby is healthy, the heart rate will go up when the baby moves and will return to normal when the baby rests. This should happen at least twice during the test. Tell a health care provider about: Any allergies you have. Any medical problems you have. All medicines you take. These include vitamins, herbs, eye drops, and creams. Any surgeries you've had. Any past pregnancies you've had. What are the risks? There are no risks to you or your baby from a nonstress test. This procedure shouldn't be painful or uncomfortable. What happens before? Eat a meal right before the test or as told by your health care team. Food may help the baby to move. Use the restroom right before the test. What happens during a nonstress test?  Two monitors will be placed on your belly. One will check your baby's heart rate, and the other will check for contractions. You may be asked to lie down on your side or to sit  up. You may be given a button to press when you feel your baby move. If your baby seems to be sleeping, you may be asked to drink some juice or soda, eat a snack, or change positions. These steps may vary. Ask what you can expect. What can I expect after? Your team will talk with you about the results and tell you the next steps. If your team gave you any diet or activity instructions, make sure to follow them. Keep all follow-up visits. This is important to check on your health and the health of your baby. This information is not intended to replace advice given to you by your health care provider. Make sure you discuss any questions you have with your health care provider. Document Revised: 04/03/2023 Document Reviewed: 04/03/2023 Elsevier Patient Education  2025 [employer].

## 2023-11-14 ENCOUNTER — Observation Stay: Payer: PRIVATE HEALTH INSURANCE | Admitting: Anesthesiology

## 2023-11-14 ENCOUNTER — Encounter: Payer: Self-pay | Admitting: Obstetrics and Gynecology

## 2023-11-14 ENCOUNTER — Other Ambulatory Visit: Payer: Self-pay

## 2023-11-14 ENCOUNTER — Inpatient Hospital Stay
Admission: EM | Admit: 2023-11-14 | Discharge: 2023-11-16 | DRG: 807 | Disposition: A | Discharge status: Home / Self Care | Payer: PRIVATE HEALTH INSURANCE | Attending: Obstetrics and Gynecology | Admitting: Obstetrics and Gynecology

## 2023-11-14 DIAGNOSIS — Z3480 Encounter for supervision of other normal pregnancy, unspecified trimester: Secondary | ICD-10-CM

## 2023-11-14 DIAGNOSIS — O99284 Endocrine, nutritional and metabolic diseases complicating childbirth: Secondary | ICD-10-CM | POA: Diagnosis present

## 2023-11-14 DIAGNOSIS — Z148 Genetic carrier of other disease: Secondary | ICD-10-CM

## 2023-11-14 DIAGNOSIS — O4292 Full-term premature rupture of membranes, unspecified as to length of time between rupture and onset of labor: Principal | ICD-10-CM | POA: Diagnosis present

## 2023-11-14 DIAGNOSIS — O99824 Streptococcus B carrier state complicating childbirth: Secondary | ICD-10-CM | POA: Diagnosis present

## 2023-11-14 DIAGNOSIS — O99820 Streptococcus B carrier state complicating pregnancy: Principal | ICD-10-CM

## 2023-11-14 DIAGNOSIS — E538 Deficiency of other specified B group vitamins: Secondary | ICD-10-CM | POA: Diagnosis present

## 2023-11-14 DIAGNOSIS — O99019 Anemia complicating pregnancy, unspecified trimester: Secondary | ICD-10-CM | POA: Diagnosis present

## 2023-11-14 DIAGNOSIS — Z833 Family history of diabetes mellitus: Secondary | ICD-10-CM

## 2023-11-14 DIAGNOSIS — Z9011 Acquired absence of right breast and nipple: Secondary | ICD-10-CM

## 2023-11-14 DIAGNOSIS — O480 Post-term pregnancy: Secondary | ICD-10-CM

## 2023-11-14 DIAGNOSIS — O9902 Anemia complicating childbirth: Secondary | ICD-10-CM | POA: Diagnosis present

## 2023-11-14 DIAGNOSIS — Z853 Personal history of malignant neoplasm of breast: Secondary | ICD-10-CM

## 2023-11-14 DIAGNOSIS — O26893 Other specified pregnancy related conditions, third trimester: Secondary | ICD-10-CM | POA: Diagnosis not present

## 2023-11-14 DIAGNOSIS — Z3490 Encounter for supervision of normal pregnancy, unspecified, unspecified trimester: Secondary | ICD-10-CM | POA: Diagnosis present

## 2023-11-14 DIAGNOSIS — Z3A40 40 weeks gestation of pregnancy: Secondary | ICD-10-CM

## 2023-11-14 DIAGNOSIS — O479 False labor, unspecified: Secondary | ICD-10-CM | POA: Diagnosis present

## 2023-11-14 LAB — CBC
HCT: 31.1 % — ABNORMAL LOW (ref 36.0–46.0)
Hemoglobin: 10.1 g/dL — ABNORMAL LOW (ref 12.0–15.0)
MCH: 24.2 pg — ABNORMAL LOW (ref 26.0–34.0)
MCHC: 32.5 g/dL (ref 30.0–36.0)
MCV: 74.6 fL — ABNORMAL LOW (ref 80.0–100.0)
Platelets: 303 K/uL (ref 150–400)
RBC: 4.17 MIL/uL (ref 3.87–5.11)
RDW: 14.8 % (ref 11.5–15.5)
WBC: 6.1 K/uL (ref 4.0–10.5)
nRBC: 0 % (ref 0.0–0.2)

## 2023-11-14 LAB — RUPTURE OF MEMBRANE (ROM)PLUS: Rom Plus: POSITIVE

## 2023-11-14 LAB — TYPE AND SCREEN
ABO/RH(D): B POS
Antibody Screen: NEGATIVE

## 2023-11-14 MED ORDER — SOD CITRATE-CITRIC ACID 500-334 MG/5ML PO SOLN: 30.0000 mL | ORAL | Status: DC | PRN

## 2023-11-14 MED ORDER — DIPHENHYDRAMINE HCL 50 MG/ML IJ SOLN: 12.5000 mg | INTRAMUSCULAR | Status: DC | PRN

## 2023-11-14 MED ORDER — LIDOCAINE HCL (PF) 1 % IJ SOLN: 30.0000 mL | INTRAMUSCULAR | Status: DC | PRN

## 2023-11-14 MED ORDER — ACETAMINOPHEN 500 MG PO TABS: 1000.0000 mg | ORAL_TABLET | Freq: Four times a day (QID) | ORAL | Status: DC | PRN

## 2023-11-14 MED ORDER — LACTATED RINGERS IV SOLN: 500.0000 mL | INTRAVENOUS | Status: DC | PRN

## 2023-11-14 MED ORDER — FENTANYL-BUPIVACAINE-NACL 0.5-0.125-0.9 MG/250ML-% EP SOLN
12.0000 mL/h | EPIDURAL | Status: DC | PRN
  Administered 2023-11-14: 12 mL/h via EPIDURAL
  Filled 2023-11-14: qty 250

## 2023-11-14 MED ORDER — PHENYLEPHRINE 80 MCG/ML (10ML) SYRINGE FOR IV PUSH (FOR BLOOD PRESSURE SUPPORT): 80.0000 ug | PREFILLED_SYRINGE | INTRAVENOUS | Status: DC | PRN

## 2023-11-14 MED ORDER — SODIUM CHLORIDE 0.9 % IV SOLN
5.0000 10*6.[IU] | Freq: Once | INTRAVENOUS | Status: AC
  Administered 2023-11-14: 5 10*6.[IU] via INTRAVENOUS
  Filled 2023-11-14: qty 5

## 2023-11-14 MED ORDER — ACETAMINOPHEN 325 MG PO TABS: 650.0000 mg | ORAL_TABLET | ORAL | Status: DC | PRN

## 2023-11-14 MED ORDER — LACTATED RINGERS IV SOLN: INTRAVENOUS | Status: DC

## 2023-11-14 MED ORDER — OXYTOCIN BOLUS FROM INFUSION
333.0000 mL | Freq: Once | INTRAVENOUS | Status: AC
  Administered 2023-11-15: 333 mL via INTRAVENOUS

## 2023-11-14 MED ORDER — ONDANSETRON HCL 4 MG/2ML IJ SOLN: 4.0000 mg | Freq: Four times a day (QID) | INTRAMUSCULAR | Status: DC | PRN

## 2023-11-14 MED ORDER — OXYTOCIN 10 UNIT/ML IJ SOLN: 10.0000 [IU] | Freq: Once | INTRAMUSCULAR | Status: DC

## 2023-11-14 MED ORDER — LIDOCAINE HCL (PF) 1 % IJ SOLN
INTRAMUSCULAR | Status: DC | PRN
Start: 2023-11-14 — End: 2023-11-15
  Administered 2023-11-14: 2 mL via SUBCUTANEOUS

## 2023-11-14 MED ORDER — EPHEDRINE 5 MG/ML INJ: 10.0000 mg | INTRAVENOUS | Status: DC | PRN

## 2023-11-14 MED ORDER — LIDOCAINE-EPINEPHRINE (PF) 1.5 %-1:200000 IJ SOLN
INTRAMUSCULAR | Status: DC | PRN
  Administered 2023-11-14: 3 mL via EPIDURAL

## 2023-11-14 MED ORDER — OXYTOCIN-SODIUM CHLORIDE 30-0.9 UT/500ML-% IV SOLN: 2.5000 [IU]/h | INTRAVENOUS | Status: DC

## 2023-11-14 MED ORDER — TERBUTALINE SULFATE 1 MG/ML IJ SOLN: 0.2500 mg | Freq: Once | INTRAMUSCULAR | Status: DC | PRN

## 2023-11-14 MED ORDER — PENICILLIN G POT IN DEXTROSE 60000 UNIT/ML IV SOLN
3.0000 10*6.[IU] | INTRAVENOUS | Status: DC
Start: 2023-11-15 — End: 2023-11-15
  Administered 2023-11-15 (×2): 3 10*6.[IU] via INTRAVENOUS
  Filled 2023-11-14 (×2): qty 50

## 2023-11-14 MED ORDER — OXYTOCIN-SODIUM CHLORIDE 30-0.9 UT/500ML-% IV SOLN
1.0000 m[IU]/min | INTRAVENOUS | Status: DC
  Administered 2023-11-14: 2 m[IU]/min via INTRAVENOUS
  Filled 2023-11-14: qty 500

## 2023-11-14 MED ORDER — LACTATED RINGERS IV SOLN
500.0000 mL | Freq: Once | INTRAVENOUS | Status: AC
  Administered 2023-11-14: 500 mL via INTRAVENOUS

## 2023-11-14 NOTE — Anesthesia Preprocedure Evaluation (Signed)
 Anesthesia Evaluation  Patient identified by MRN, date of birth, ID band Patient awake    Reviewed: Allergy & Precautions, H&P , NPO status , Patient's Chart, lab work & pertinent test results  Airway Mallampati: II  TM Distance: >3 FB Neck ROM: full    Dental no notable dental hx.    Pulmonary neg pulmonary ROS   Pulmonary exam normal        Cardiovascular Exercise Tolerance: Good negative cardio ROS Normal cardiovascular exam     Neuro/Psych    GI/Hepatic negative GI ROS,,,  Endo/Other    Renal/GU   negative genitourinary   Musculoskeletal   Abdominal   Peds  Hematology  (+) Blood dyscrasia, anemia   Anesthesia Other Findings Past Medical History: No date: Allergic rhinitis No date: Anemia No date: Breast cancer (HCC) No date: Raynaud phenomenon No date: Thalassemia  Past Surgical History: No date: NO PAST SURGERIES  BMI    Body Mass Index: 30.04 kg/m      Reproductive/Obstetrics (+) Pregnancy                              Anesthesia Physical Anesthesia Plan  ASA: 2  Anesthesia Plan: Epidural   Post-op Pain Management:    Induction:   PONV Risk Score and Plan:   Airway Management Planned:   Additional Equipment:   Intra-op Plan:   Post-operative Plan:   Informed Consent: I have reviewed the patients History and Physical, chart, labs and discussed the procedure including the risks, benefits and alternatives for the proposed anesthesia with the patient or authorized representative who has indicated his/her understanding and acceptance.       Plan Discussed with: Anesthesiologist and CRNA  Anesthesia Plan Comments:          Anesthesia Quick Evaluation

## 2023-11-14 NOTE — H&P (Signed)
 [facility] Labor & Delivery  History and Physical   HPI   Chief Complaint: contractions   Angel Pacheco is a 33 y.o. [phone] at [redacted]w[redacted]d who presents for contractions that began today at 525pm, she also reports having her underwear wet 3x today, first time noted was at 730am. Fluid clear, no odor.    Patient reports active baby, denies vaginal bleeding, headache, visual changes or RUQ pain.  Pregnancy Complications Patient Active Problem List   Diagnosis Date Noted   Uterine contractions 11/14/2023   Encounter for induction of labor 11/14/2023   GBS (group B Streptococcus carrier), +RV culture, currently pregnant 11/06/2023   Labor and delivery, indication for care 10/29/2023   B12 deficiency 09/23/2023   Anemia during pregnancy 07/07/2023   History of breast cancer 07/02/2023   Preterm delivery 07/02/2023   Supervision of other normal pregnancy, antepartum 06/23/2023   H/O right mastectomy 09/03/2022   Alpha thalassemia trait 09/03/2019    Review of Systems A twelve point review of systems was negative except as stated in HPI.   HISTORY   Medications Medications Prior to Admission  Medication Sig Dispense Refill Last Dose/Taking   ferrous sulfate  325 (65 FE) MG tablet Take 1 tablet (325 mg total) by mouth daily. (Patient not taking: Reported on 11/07/2023) 60 tablet 3    Prenatal Vit-Fe Fumarate-FA (MULTIVITAMIN-PRENATAL) 27-0.8 MG TABS tablet Take 1 tablet by mouth daily at 12 noon.       Allergies has no known allergies.   OB History OB History  Gravida Para Term Preterm AB Living  4 3 2 1  0 3  SAB IAB Ectopic Multiple Live Births  0 0 0 0 3    # Outcome Date GA Lbr Len/2nd Weight Sex Type Anes PTL Lv  4 Current           3 Preterm 07/05/21 [redacted]w[redacted]d 00:36 / 00:06 1915 g F Vag-Spont  N LIV     Name: Kollmann,BABY GIRL  Latima     Apgar1: 7  Apgar5: 8  2 Term 08/13/11 [redacted]w[redacted]d  2608 g M Vag-Spont EPI N LIV  1 Term 05/18/09 [redacted]w[redacted]d  2637 g F  Vag-Spont EPI  LIV    Past Medical History Past Medical History:  Diagnosis Date   Allergic rhinitis    Anemia    Breast cancer (HCC)    Raynaud phenomenon    Thalassemia     Past Surgical History Past Surgical History:  Procedure Laterality Date   NO PAST SURGERIES      Social History  reports that she has never smoked. She has been exposed to tobacco smoke. She has never used smokeless tobacco. She reports that she does not drink alcohol and does not use drugs.   Family History family history includes Asthma in her maternal grandmother; Breast cancer in her maternal aunt; Diabetes in her mother; Gestational diabetes in her sister; Stroke in her father.   PHYSICAL EXAM   Vitals:   11/14/23 1924  BP: 112/62  Pulse: 91  Resp: 18  Temp: 98.7 F (37.1 C)  TempSrc: Oral  Weight: 72.1 kg  Height: 5' 1 (1.549 m)    Constitutional: No acute distress, well appearing, and well nourished. Neurologic: She is alert and conversational.  Psychiatric: She has a normal mood and affect.  Musculoskeletal: Normal gait, grossly normal range of motion Cardiovascular: Normal rate.   Pulmonary/Chest: Normal work of breathing.  Gastrointestinal/Abdominal: Soft. Gravid. There is no tenderness.  Skin: Skin  is warm and dry. No rash noted.  Genitourinary: Normal external female genitalia.  SVE:   Dilation: 3 Effacement (%): 80 Station: -2 Presentation: Vertex Exam by:: Jayne, CNM   NST Interpretation Indication: contractions  Baseline: 130 bpm Variability: moderate Accelerations: present Decelerations: absent Contractions: irregular, every 1.5-5 minutes Time noted:  See OBIX Impression: reactive Authenticated by: Harlene LITTIE Jayne    PRENATAL LABS FROM OB RESULTS CONSOLE  ABO, Rh: --/--/B POS Performed at [facility], [address] [place], [place] [address]  [id number]) Antibody: Negative (03/12 1450) Rubella: 4.31 (03/12 1450) Varicella: Reactive  (03/12 1450) RPR: Non Reactive (04/30 0949)  HBsAg: Negative (03/12 1450)  HC No results found for: HCVAB HIV: Non Reactive (04/30 0949)  GC:  Negative 06/20 CT:  Negative 06/20 1h GTT:  Lab Results  Component Value Date   GLUCOSE 79 09/23/2023    GBS: Positive/-- (06/20 1628)  ENCOUNTER LABS   Results for orders placed or performed during the hospital encounter of 11/14/23 (from the past 24 hours)  Rupture of Membrane (ROM) Plus     Status: None   Collection Time: 11/14/23  7:30 PM  Result Value Ref Range   Rom Plus POSITIVE      ASSESSMENT AND PLAN   Angel Pacheco is a 33 y.o. [id number] at [redacted]w[redacted]d with EDD: 11/12/2023, by Ultrasound admitted for rupture of membranes.   Induction indication: PROM, GBS positive  Labor management: - Start pitocin augmentation  Fetal Status: - cephalic presentation by Leopold's maneuvers and SVE - EFW: 7lb by Leopold's - CEFM - FHR currently category 1  Pain management: - desires epidural  GBS carrier - Penicillin for prophylaxis  Hx breast cancer with right mastectomy - Oncology care at [facility], triple negative with local lymph node involvement  Alpha thalassemia trait/anemia - hematology recommends blood transfusion if needed, no further IV Fe given ferritin elevated - B12 deficiency, has received injections x4   Labs/Immunizations: TDAP: Given prenatally. Flu: n/a RSV: No Rubella: Immune Varicella: Immune    Postpartum Plan: - Feeding: Formula - Contraception: plans Postpartum BTL - Prenatal Care Provider: AOB  Attending Dr. Verdon was immediately available for the care of the patient.

## 2023-11-14 NOTE — OB Triage Note (Signed)
 Pt states that ctx's started at 1725 this evening and that she believes that she possible had ROM at 0530 this morning.

## 2023-11-14 NOTE — Anesthesia Procedure Notes (Signed)
 Epidural Patient location during procedure: OB Start time: 11/14/2023 9:30 PM End time: 11/14/2023 9:45 PM  Staffing Anesthesiologist: Vicci Camellia Glatter, MD Performed: anesthesiologist   Preanesthetic Checklist Completed: patient identified, IV checked, site marked, risks and benefits discussed, surgical consent, monitors and equipment checked, pre-op evaluation and timeout performed  Epidural Patient position: sitting Prep: ChloraPrep Patient monitoring: heart rate, continuous pulse ox and blood pressure Approach: midline Location: L3-L4 Injection technique: LOR saline  Needle:  Needle type: Tuohy  Needle gauge: 18 G Needle length: 9 cm Needle insertion depth: 7 cm Catheter type: closed end Catheter size: 20 Guage Catheter at skin depth: 12 cm Test dose: negative and 1.5% lidocaine  with Epi 1:200 K  Assessment Events: blood not aspirated, no cerebrospinal fluid, injection not painful, no injection resistance and no paresthesia  Additional Notes Reason for block:procedure for pain

## 2023-11-15 ENCOUNTER — Encounter: Payer: Self-pay | Admitting: Obstetrics and Gynecology

## 2023-11-15 ENCOUNTER — Encounter: Payer: Self-pay | Admitting: Oncology

## 2023-11-15 DIAGNOSIS — D649 Anemia, unspecified: Secondary | ICD-10-CM

## 2023-11-15 DIAGNOSIS — O99820 Streptococcus B carrier state complicating pregnancy: Secondary | ICD-10-CM

## 2023-11-15 DIAGNOSIS — Z3A40 40 weeks gestation of pregnancy: Secondary | ICD-10-CM

## 2023-11-15 DIAGNOSIS — O480 Post-term pregnancy: Secondary | ICD-10-CM | POA: Diagnosis not present

## 2023-11-15 DIAGNOSIS — O99284 Endocrine, nutritional and metabolic diseases complicating childbirth: Secondary | ICD-10-CM | POA: Diagnosis present

## 2023-11-15 DIAGNOSIS — O26893 Other specified pregnancy related conditions, third trimester: Secondary | ICD-10-CM | POA: Diagnosis present

## 2023-11-15 DIAGNOSIS — O9902 Anemia complicating childbirth: Secondary | ICD-10-CM

## 2023-11-15 DIAGNOSIS — O99824 Streptococcus B carrier state complicating childbirth: Secondary | ICD-10-CM | POA: Diagnosis present

## 2023-11-15 DIAGNOSIS — O4292 Full-term premature rupture of membranes, unspecified as to length of time between rupture and onset of labor: Secondary | ICD-10-CM | POA: Diagnosis present

## 2023-11-15 DIAGNOSIS — O479 False labor, unspecified: Secondary | ICD-10-CM | POA: Diagnosis present

## 2023-11-15 DIAGNOSIS — O09293 Supervision of pregnancy with other poor reproductive or obstetric history, third trimester: Secondary | ICD-10-CM

## 2023-11-15 DIAGNOSIS — E538 Deficiency of other specified B group vitamins: Secondary | ICD-10-CM | POA: Diagnosis present

## 2023-11-15 DIAGNOSIS — Z9011 Acquired absence of right breast and nipple: Secondary | ICD-10-CM | POA: Diagnosis not present

## 2023-11-15 DIAGNOSIS — Z833 Family history of diabetes mellitus: Secondary | ICD-10-CM | POA: Diagnosis not present

## 2023-11-15 DIAGNOSIS — Z148 Genetic carrier of other disease: Secondary | ICD-10-CM | POA: Diagnosis not present

## 2023-11-15 DIAGNOSIS — Z853 Personal history of malignant neoplasm of breast: Secondary | ICD-10-CM | POA: Diagnosis not present

## 2023-11-15 LAB — RPR: RPR Ser Ql: NONREACTIVE

## 2023-11-15 MED ORDER — WITCH HAZEL-GLYCERIN EX PADS: 1.0000 | MEDICATED_PAD | CUTANEOUS | Status: DC | PRN

## 2023-11-15 MED ORDER — OXYTOCIN 10 UNIT/ML IJ SOLN
INTRAMUSCULAR | Status: AC
Start: 2023-11-15 — End: 2023-11-15
  Filled 2023-11-15: qty 2

## 2023-11-15 MED ORDER — IBUPROFEN 600 MG PO TABS
600.0000 mg | ORAL_TABLET | Freq: Four times a day (QID) | ORAL | Status: DC
  Administered 2023-11-15 – 2023-11-16 (×5): 600 mg via ORAL
  Filled 2023-11-15 (×5): qty 1

## 2023-11-15 MED ORDER — ZOLPIDEM TARTRATE 5 MG PO TABS: 5.0000 mg | ORAL_TABLET | Freq: Every evening | ORAL | Status: DC | PRN

## 2023-11-15 MED ORDER — ONDANSETRON HCL 4 MG/2ML IJ SOLN: 4.0000 mg | INTRAMUSCULAR | Status: DC | PRN

## 2023-11-15 MED ORDER — SIMETHICONE 80 MG PO CHEW: 80.0000 mg | CHEWABLE_TABLET | ORAL | Status: DC | PRN

## 2023-11-15 MED ORDER — ACETAMINOPHEN 500 MG PO TABS: 1000.0000 mg | ORAL_TABLET | Freq: Four times a day (QID) | ORAL | Status: DC | PRN

## 2023-11-15 MED ORDER — COCONUT OIL OIL: 1.0000 | TOPICAL_OIL | Status: DC | PRN

## 2023-11-15 MED ORDER — DOCUSATE SODIUM 100 MG PO CAPS
100.0000 mg | ORAL_CAPSULE | Freq: Two times a day (BID) | ORAL | Status: DC
  Administered 2023-11-16: 100 mg via ORAL
  Filled 2023-11-15: qty 1

## 2023-11-15 MED ORDER — DIBUCAINE (PERIANAL) 1 % EX OINT: 1.0000 | TOPICAL_OINTMENT | CUTANEOUS | Status: DC | PRN

## 2023-11-15 MED ORDER — MISOPROSTOL 200 MCG PO TABS
ORAL_TABLET | ORAL | Status: AC
  Filled 2023-11-15: qty 4

## 2023-11-15 MED ORDER — BENZOCAINE-MENTHOL 20-0.5 % EX AERO: 1.0000 | INHALATION_SPRAY | CUTANEOUS | Status: DC | PRN

## 2023-11-15 MED ORDER — ONDANSETRON HCL 4 MG PO TABS: 4.0000 mg | ORAL_TABLET | ORAL | Status: DC | PRN

## 2023-11-15 MED ORDER — LIDOCAINE HCL (PF) 1 % IJ SOLN
INTRAMUSCULAR | Status: AC
  Filled 2023-11-15: qty 30

## 2023-11-15 MED ORDER — DIPHENHYDRAMINE HCL 25 MG PO CAPS: 25.0000 mg | ORAL_CAPSULE | Freq: Four times a day (QID) | ORAL | Status: DC | PRN

## 2023-11-15 MED ORDER — AMMONIA AROMATIC IN INHA
RESPIRATORY_TRACT | Status: AC
  Filled 2023-11-15: qty 10

## 2023-11-15 MED ORDER — PRENATAL MULTIVITAMIN CH
1.0000 | ORAL_TABLET | Freq: Every day | ORAL | Status: DC
  Administered 2023-11-15 – 2023-11-16 (×2): 1 via ORAL
  Filled 2023-11-15 (×3): qty 1

## 2023-11-15 NOTE — Progress Notes (Signed)
 Labor Progress Note   ASSESSMENT/PLAN   Angel Pacheco 33 y.o.   [id number]  at [redacted]w[redacted]d here with PROM.  FWB:  - Fetal well being assessed:       Category I GBS: - GBS positive  LABOR: - Now in early labor labor, doing well. - Pain Management: epidural in place - Discussed options with patient and continue pitocin  titration - Anticipate SVD   Principal Problem:   Uterine contractions Active Problems:   Anemia during pregnancy     Overview: 20w NOB - 9.5 - Fe sent to pharmacy     ( x ) 28 week CBC- 8.3, orders placed for 3 doses of Venofer , MyChart      message sent to patient to schedule infusions with Same Day   GBS (group B Streptococcus carrier), +RV culture, currently pregnant   Encounter for induction of labor   SUBJECTIVE/OBJECTIVE   SUBJECTIVE: Comfortable with epidural    OBJECTIVE: Vital Signs: Patient Vitals for the past 12 hrs:  BP Temp Temp src Pulse Resp SpO2 Height Weight  11/14/23 2158 115/72 -- -- 84 -- 100 % -- --  11/14/23 1924 112/62 98.7 F (37.1 C) Oral 91 18 -- 5' 1 (1.549 m) 72.1 kg    Last SVE:  Dilation: 3.5 Effacement (%): 80 Cervical Position: Posterior Station: -3 Presentation: Vertex Exam by:: RRL -  , Rupture Date: 11/14/23, Rupture Time: 0730,    FHR:   - Mode: External  - Baseline Rate (A): 120 bpm  -  Moderate variability  - Characteristics (ie - accels, decels): Accelerations: 15 x 15  -  Decelerations absent  UTERINE ACTIVITY:   - Mode: Toco  - Contraction Frequency (min): 2-2.5 minutes

## 2023-11-15 NOTE — Progress Notes (Signed)
 Labor Progress Note   ASSESSMENT/PLAN   Angel Pacheco 33 y.o.   [id number]  at [redacted]w[redacted]d here with PROM now in active labor.  FWB:  - Fetal well being assessed: Category II       GBS: - GBS positive - Continue penicillin  for prophylaxis  LABOR: - Now in active labor, doing well. - Pain Management: epidural in place, managed by anesthesia - Discussed options with patient and AROM performed for small amount clear fluid - Anticipate SVD   Principal Problem:   Uterine contractions Active Problems:   Anemia during pregnancy     Overview: 20w NOB - 9.5 - Fe sent to pharmacy     ( x ) 28 week CBC- 8.3, orders placed for 3 doses of Venofer , MyChart      message sent to patient to schedule infusions with Same Day   GBS (group B Streptococcus carrier), +RV culture, currently pregnant   Encounter for induction of labor   SUBJECTIVE/OBJECTIVE   SUBJECTIVE: Comfortable with epidural    OBJECTIVE: Vital Signs: Patient Vitals for the past 12 hrs:  BP Temp Temp src Pulse Resp SpO2 Height Weight  11/15/23 0328 -- 98.7 F (37.1 C) Oral -- -- -- -- --  11/15/23 0000 -- 98.4 F (36.9 C) -- -- -- -- -- --  11/14/23 2158 115/72 -- -- 84 -- 100 % -- --  11/14/23 2145 114/81 98.1 F (36.7 C) Oral (!) 103 14 -- -- --  11/14/23 1924 112/62 98.7 F (37.1 C) Oral 91 18 -- 5' 1 (1.549 m) 72.1 kg    Last SVE:  Dilation: 7 Effacement (%): 80 Cervical Position: Middle Station: -2 Presentation: Vertex Exam by:: RRL -  , Rupture Date: 11/14/23, Rupture Time: 0730,    FHR:   - Mode: External  - Baseline Rate (A): 120 bpm  -  Moderate variability   - Characteristics (ie - accels, decels): Accelerations: 15 x 15  -  Variables present  UTERINE ACTIVITY:   - Mode: Toco  - Contraction Frequency (min): 2.5-3 minutes

## 2023-11-15 NOTE — Discharge Summary (Signed)
 Postpartum Discharge Summary  Date of Service updated***     Patient Name: Angel Pacheco DOB: [birth year] MRN: [id number]  Date of admission: 11/14/2023 Delivery date:11/15/2023 Delivering provider: JAYNE HARLENE CROME Date of discharge: 11/15/2023  Admitting diagnosis: Uterine contractions [O47.9] Encounter for induction of labor [Z34.90] Uterine contractions during pregnancy [O47.9] Intrauterine pregnancy: [redacted]w[redacted]d     Secondary diagnosis:  Principal Problem:   Vaginal delivery Active Problems:   Anemia during pregnancy   Postpartum care following vaginal delivery  Additional problems: Hx triple negative breast cancer in remission with R mastectomy    Discharge diagnosis: Term Pregnancy Delivered                                              Post partum procedures:{Postpartum procedures:23558} Augmentation: AROM and Pitocin  Complications: None  Hospital course: Onset of Labor With Vaginal Delivery      33 y.o. yo [id number] at [redacted]w[redacted]d was admitted in Latent Labor on 11/14/2023. Labor course was complicated by none  Membrane Rupture Time/Date: 7:30 AM,11/14/2023  Delivery Method:Vaginal, Spontaneous Operative Delivery:N/A Episiotomy: None Lacerations:  None Patient had a postpartum course complicated by ***.  She is ambulating, tolerating a regular diet, passing flatus, and urinating well. Patient is discharged home in stable condition on 11/15/23.  Newborn Data: Birth date:11/15/2023 Birth time:6:30 AM Gender:Female Living status:Living Apgars:8 ,9  Weight:   Magnesium Sulfate received: No BMZ received: No Rhophylac:N/A MMR:N/A T-DaP:Given prenatally Flu: N/A RSV Vaccine received: No Transfusion:{Transfusion received:30440034} Immunizations administered: Immunization History  Administered Date(s) Administered   Tdap 06/21/2011, 08/20/2023    Physical exam  Vitals:   11/14/23 2145 11/14/23 2158 11/15/23 0000 11/15/23 0328  BP: 114/81 115/72    Pulse: (!) 103 84     Resp: 14     Temp: 98.1 F (36.7 C)  98.4 F (36.9 C) 98.7 F (37.1 C)  TempSrc: Oral   Oral  SpO2:  100%    Weight:      Height:       General: {Exam; general:21111117} Lochia: {Desc; appropriate/inappropriate:30686::appropriate} Uterine Fundus: {Desc; firm/soft:30687} Incision: {Exam; incision:21111123} DVT Evaluation: {Exam; dvt:2111122} Labs: Lab Results  Component Value Date   WBC 6.1 11/14/2023   HGB 10.1 (L) 11/14/2023   HCT 31.1 (L) 11/14/2023   MCV 74.6 (L) 11/14/2023   PLT 303 11/14/2023      Latest Ref Rng & Units 09/23/2023    3:58 PM  CMP  Glucose 70 - 99 mg/dL 79   BUN 6 - 20 mg/dL 11   Creatinine 9.55 - 1.00 mg/dL 9.55   Sodium 864 - 854 mmol/L 136   Potassium 3.5 - 5.1 mmol/L 3.2   Chloride 98 - 111 mmol/L 109   CO2 22 - 32 mmol/L 18   Calcium 8.9 - 10.3 mg/dL 8.6   Total Protein 6.5 - 8.1 g/dL 6.7   Total Bilirubin 0.0 - 1.2 mg/dL 0.4   Alkaline Phos 38 - 126 U/L 103   AST 15 - 41 U/L 18   ALT 0 - 44 U/L 15    Edinburgh Score:    10/10/2023    4:24 PM  Edinburgh Postnatal Depression Scale Screening Tool  I have been able to laugh and see the funny side of things. 0  I have looked forward with enjoyment to things. 0  I have blamed myself unnecessarily when things  went wrong. 1  I have been anxious or worried for no good reason. 1  I have felt scared or panicky for no good reason. 0  Things have been getting on top of me. 1  I have been so unhappy that I have had difficulty sleeping. 0  I have felt sad or miserable. 0  I have been so unhappy that I have been crying. 0  The thought of harming myself has occurred to me. 0  Edinburgh Postnatal Depression Scale Total 3      After visit meds:  Allergies as of 11/15/2023   No Known Allergies   Med Rec must be completed prior to using this [facility]***        Discharge home in stable condition Infant Feeding: Bottle Infant Disposition:{CHL IP OB HOME WITH FNUYZM:76418} Discharge  instruction: per After Visit Summary and Postpartum booklet. Activity: Advance as tolerated. Pelvic rest for 6 weeks.  Diet: routine diet Anticipated Birth Control: Plans Interval BTL Postpartum Appointment:6 weeks Additional Postpartum F/U: Postpartum Depression checkup and pre-op for planned BTL Future Appointments: Future Appointments  Date Time Provider Department Center  11/21/2023  3:55 PM Justino Eleanor HERO, CNM [facility] None  01/14/2024  2:30 PM [facility] None  01/14/2024  2:45 PM Babara Call, MD [facility] None   Follow up Visit:      11/15/2023 Harlene LITTIE Cisco, CNM

## 2023-11-16 LAB — CBC
HCT: 27.5 % — ABNORMAL LOW (ref 36.0–46.0)
Hemoglobin: 8.6 g/dL — ABNORMAL LOW (ref 12.0–15.0)
MCH: 23.4 pg — ABNORMAL LOW (ref 26.0–34.0)
MCHC: 31.3 g/dL (ref 30.0–36.0)
MCV: 74.9 fL — ABNORMAL LOW (ref 80.0–100.0)
Platelets: 226 K/uL (ref 150–400)
RBC: 3.67 MIL/uL — ABNORMAL LOW (ref 3.87–5.11)
RDW: 14.7 % (ref 11.5–15.5)
WBC: 5.1 K/uL (ref 4.0–10.5)
nRBC: 0 % (ref 0.0–0.2)

## 2023-11-16 NOTE — Discharge Instructions (Signed)

## 2023-11-16 NOTE — Final Progress Note (Signed)
 Post Partum Day 1 Subjective: Angel Pacheco is feeling well overall. She is ambulating, voiding, and tolerating POs without difficulty. Her pain is well-controlled and her bleeding is WNL. Her mood is stable. Bottle feeding is going well.   Objective: Blood pressure 110/72, pulse 70, temperature 98 F (36.7 C), temperature source Oral, resp. rate 20, height 5' 1 (1.549 m), weight 72.1 kg, last menstrual period 02/05/2023, SpO2 100%, unknown if currently breastfeeding.  Physical Exam:  General: alert, cooperative, and appears stated age 33: appropriate Uterine Fundus: firm Incision: N/A DVT Evaluation: No evidence of DVT seen on physical exam.  Recent Labs    11/14/23 2057 11/16/23 0654  HGB 10.1* 8.6*  HCT 31.1* 27.5*    Assessment/Plan: Discharge home PO iron  Discharge instructions reviewed Video visit in 2 weeks, PP office visit in 6 weeks Plans interval BTL   LOS: 1 day   Eleanor CHRISTELLA Canny, CNM 11/16/2023, 1:08 PM

## 2023-11-16 NOTE — Progress Notes (Signed)
 Patient discharged home with family.  Patient will be escorted out by auxiliary.   Elyn Sharps, RN 11/16/23@1430 

## 2023-11-16 NOTE — Anesthesia Postprocedure Evaluation (Signed)
 Anesthesia Post Note  Patient: Angel Pacheco  Procedure(s) Performed: AN AD HOC LABOR EPIDURAL  Patient location during evaluation: Mother Baby Anesthesia Type: Epidural Level of consciousness: awake and alert Pain management: pain level controlled Vital Signs Assessment: post-procedure vital signs reviewed and stable Respiratory status: spontaneous breathing, nonlabored ventilation and respiratory function stable Cardiovascular status: stable Postop Assessment: no headache, no backache, patient able to bend at knees and able to ambulate Anesthetic complications: no   No notable events documented.   Last Vitals:  Vitals:   11/16/23 0259 11/16/23 0839  BP: 113/80 110/72  Pulse: 66 70  Resp: 20 20  Temp: 36.4 C 36.7 C  SpO2: 100% 100%    Last Pain:  Vitals:   11/16/23 0839  TempSrc: Oral  PainSc:                  Fairy POUR Jasmine Mcbeth

## 2023-11-16 NOTE — Plan of Care (Signed)
 Discharge instructions, when to follow up, and prescriptions reviewed with patient.  Patient verbalized understanding. Elyn Sharps, RN 11/16/23 @1245 

## 2023-11-17 ENCOUNTER — Encounter: Payer: Self-pay | Admitting: Oncology

## 2023-11-21 ENCOUNTER — Encounter: Payer: PRIVATE HEALTH INSURANCE | Admitting: Obstetrics

## 2023-12-04 ENCOUNTER — Telehealth: Payer: PRIVATE HEALTH INSURANCE | Admitting: Certified Nurse Midwife

## 2023-12-08 NOTE — Patient Instructions (Signed)

## 2023-12-08 NOTE — Progress Notes (Unsigned)
   Virtual Visit via Video Note  I connected with NAME@ on 12/09/23 at   9:35 AM EDT by a video enabled telemedicine application and verified that I am speaking with the correct person using two identifiers.  Location: Patient: at home Provider: Office   I discussed the limitations of evaluation and management by telemedicine and the availability of in person appointments. The patient expressed understanding and agreed to proceed.    History of Present Illness:   Angel Pacheco is a 33 y.o. [phone] female who presents for a 2 week televisit for mood check. She is 2 weeks postpartum following a normal spontaneous vaginal delivery.  The delivery was at 40.3 gestational weeks.  Postpartum course has been well so far. Baby is feeding by formula. Bleeding: light. Postpartum depression screening: negative.        The following portions of the patient's history were reviewed and updated as appropriate: allergies, current medications, past family history, past medical history, past social history, past surgical history, and problem list.   Observations/Objective:   unknown if currently breastfeeding. Gen App: NAD Psych: normal speech, affect. Good mood.        11/15/2023    7:20 PM 10/10/2023    4:24 PM  Edinburgh Postnatal Depression Scale Screening Tool  I have been able to laugh and see the funny side of things. 0 0  I have looked forward with enjoyment to things. 0 0  I have blamed myself unnecessarily when things went wrong. 1 1  I have been anxious or worried for no good reason. 0 1  I have felt scared or panicky for no good reason. 0 0  Things have been getting on top of me. 1 1  I have been so unhappy that I have had difficulty sleeping. 0 0  I have felt sad or miserable. 0 0  I have been so unhappy that I have been crying. 0 0  The thought of harming myself has occurred to me. 0 0  Edinburgh Postnatal Depression Scale Total 2 3         Assessment and Plan:   1.  Encounter for screening for maternal depression - Screening Negative today. Will rescreen at 6 week postpartum visit. Overall doing well.    2. Postpartum state - Overall doing well. Continue routine postpartum home care.    3. Contraception -has BTL scheduled with [facility]   Follow Up Instructions:     I discussed the assessment and treatment plan with the patient. The patient was provided an opportunity to ask questions and all were answered. The patient agreed with the plan and demonstrated an understanding of the instructions.   The patient was advised to call back or seek an in-person evaluation if the symptoms worsen or if the condition fails to improve as anticipated.   I provided 15 minutes of non-face-to-face time during this encounter.     Damien Parsley, CNM [facility]

## 2023-12-09 ENCOUNTER — Telehealth (INDEPENDENT_AMBULATORY_CARE_PROVIDER_SITE_OTHER): Payer: PRIVATE HEALTH INSURANCE | Admitting: Certified Nurse Midwife

## 2023-12-26 NOTE — Progress Notes (Signed)
 Post Partum Visit Note  Ronald P Colden is a 33 y.o. [phone] female who presents for a postpartum visit. She is 6 weeks postpartum following a normal spontaneous vaginal delivery.  I have fully reviewed the prenatal and intrapartum course. The delivery was at 40.3 gestational weeks.  Anesthesia: epidural. Postpartum course has been uncomplicated. Baby girl Delia is doing well. Baby is feeding by bottle. Bleeding no bleeding. Bowel function is normal. Bladder function is normal. Patient is sexually active. Contraception method is withdrawal, planning BTL & pre-op is scheduled. Postpartum depression screening: negative.   Upstream - 12/29/23 1719       Pregnancy Intention Screening   Does the patient want to become pregnant in the next year? No    Does the patient's partner want to become pregnant in the next year? No    Would the patient like to discuss contraceptive options today? No      Contraception Wrap Up   Current Method Withdrawal or Other Method    End Method --   Pre-op scheduled for BTL   Contraception Counseling Provided Yes    How was the end contraceptive method provided? N/A         The pregnancy intention screening data noted above was reviewed. Potential methods of contraception were discussed. The patient elected to proceed with -- (Pre-op scheduled for BTL).    Health Maintenance Due  Topic Date Due   COVID-19 Vaccine (1) Never done   Pneumococcal Vaccine (1 of 2 - PCV) Never done   Hepatitis B Vaccines 19-59 Average Risk (1 of 3 - 19+ 3-dose series) Never done   HPV VACCINES (1 - Risk 3-dose SCDM series) Never done   Cervical Cancer Screening (HPV/Pap Cotest)  Never done   Influenza Vaccine  Never done    The following portions of the patient's history were reviewed and updated as appropriate: allergies, current medications, past family history, past medical history, past social history, past surgical history, and problem list.  Review of  Systems Pertinent items are noted in HPI.  Objective:  BP (!) 151/102   Pulse 79   Ht 5' 1 (1.549 m)   Wt 141 lb (64 kg)   BMI 26.64 kg/m    General:  alert, cooperative, appears stated age, and no distress   Breasts:  Right breast surgically absent, Left breast without masses, soft, nontender, nipple everted & intact, no axillary lymphadenopathy bilaterally  Lungs: clear to auscultation bilaterally  Heart:  regular rate and rhythm, S1, S2 normal, no murmur, click, rub or gallop  Abdomen: Soft, nontender, DR 1FB   GU exam:  NEFG, vagina pink, well rugated, cervix multiparous, pap collected       Assessment:   1. Postpartum care following vaginal delivery (Primary)  2. Encounter for screening for maternal depression  3. Cervical cancer screening - Cytology - PAP  4. Encounter for screening for human papillomavirus (HPV) - Cytology - PAP  Plan:   Essential components of care per ACOG recommendations:  1.  Mood and well being: Patient with negative depression screening today. Reviewed local resources for support.  - Patient tobacco use? No.   - hx of drug use? No.    2. Infant care and feeding:  -Patient currently breastmilk feeding? No.  -Social determinants of health (SDOH) reviewed in EPIC. No concerns  3. Sexuality, contraception and birth spacing - Patient does not want a pregnancy in the next year.  Desired family size is 4 children.  -  Reviewed reproductive life planning. Reviewed contraceptive methods based on pt preferences and effectiveness.  Patient has pre-op scheduled for BTL, reinforced use of condoms or spermicide in addition to withdrawal for contraception.     4. Sleep and fatigue -Encouraged family/partner/community support of 4 hrs of uninterrupted sleep to help with mood and fatigue  5. Physical Recovery  - Discussed patients delivery and complications. She describes her labor as good. - Patient had a Vaginal, no problems at delivery.  - Patient  has urinary incontinence? No. - Patient is safe to resume physical and sexual activity  6.  Health Maintenance - HM due items addressed Yes - Last pap smear Hx LSIL HPV+ 2022. Pap smear done at today's visit.    7. Chronic Disease/Pregnancy Condition follow up: Breast cancer hx-follows with [facility]  - PCP follow up  Harlene LITTIE Cisco, CNM [facility]

## 2023-12-29 ENCOUNTER — Ambulatory Visit: Payer: PRIVATE HEALTH INSURANCE | Admitting: Certified Nurse Midwife

## 2023-12-29 ENCOUNTER — Encounter: Payer: Self-pay | Admitting: Certified Nurse Midwife

## 2023-12-29 ENCOUNTER — Other Ambulatory Visit (HOSPITAL_COMMUNITY)
Admission: RE | Admit: 2023-12-29 | Discharge: 2023-12-29 | Disposition: A | Discharge status: Home / Self Care | Payer: PRIVATE HEALTH INSURANCE | Source: Ambulatory Visit | Attending: Certified Nurse Midwife | Admitting: Certified Nurse Midwife

## 2023-12-29 DIAGNOSIS — Z124 Encounter for screening for malignant neoplasm of cervix: Secondary | ICD-10-CM

## 2023-12-29 DIAGNOSIS — Z1151 Encounter for screening for human papillomavirus (HPV): Secondary | ICD-10-CM | POA: Insufficient documentation

## 2023-12-29 DIAGNOSIS — Z1332 Encounter for screening for maternal depression: Secondary | ICD-10-CM

## 2023-12-31 LAB — CYTOLOGY - PAP
Comment: NEGATIVE
Diagnosis: NEGATIVE
High risk HPV: NEGATIVE

## 2024-01-01 ENCOUNTER — Ambulatory Visit: Payer: Self-pay | Admitting: Certified Nurse Midwife

## 2024-01-01 DIAGNOSIS — Z8742 Personal history of other diseases of the female genital tract: Secondary | ICD-10-CM | POA: Insufficient documentation

## 2024-01-05 ENCOUNTER — Ambulatory Visit: Payer: PRIVATE HEALTH INSURANCE | Admitting: Obstetrics & Gynecology

## 2024-01-05 ENCOUNTER — Encounter: Payer: Self-pay | Admitting: Obstetrics & Gynecology

## 2024-01-05 VITALS — BP 146/97 | HR 89 | Ht 61.0 in | Wt 141.0 lb

## 2024-01-05 DIAGNOSIS — Z3009 Encounter for other general counseling and advice on contraception: Secondary | ICD-10-CM | POA: Diagnosis not present

## 2024-01-05 DIAGNOSIS — Z01818 Encounter for other preprocedural examination: Secondary | ICD-10-CM

## 2024-01-05 DIAGNOSIS — Z01812 Encounter for preprocedural laboratory examination: Secondary | ICD-10-CM

## 2024-01-05 NOTE — Progress Notes (Signed)
    GYNECOLOGY PROGRESS NOTE  Subjective:    Patient ID: Angel Pacheco, female    DOB: 10/23/1990, 33 y.o.   MRN: [id number]  HPI  Patient is a 33 y.o.married  [id number] (2 months old, 35 year old, 18 and 59 yo kids) here today because she wants permanent sterility. Her husband is not willing to get a vasectomy. She has never had a surgery in her abdomen. She is using withdrawal for contraception.  The following portions of the patient's history were reviewed and updated as appropriate: allergies, current medications, past family history, past medical history, past social history, past surgical history, and problem list.  Review of Systems Pertinent items are noted in HPI.  Pap smear UTD and normal. She has a h/o breast cancer (estrogen receptor negative) and reports negative genetic testing.  Objective:   Blood pressure (!) 142/91, pulse (!) 102, height 5' 1 (1.549 m), weight 141 lb (64 kg), last menstrual period 12/28/2023, not currently breastfeeding. Body mass index is 26.64 kg/m. Well nourished, well hydrated Black female, no apparent distress She is ambulating and conversing normally. Exam deferred   Assessment:   Unwanted fertility  Plan:   Plan for laparoscopic bilateral salpingectomy  She understands the risks of surgery, including, but not to infection, bleeding, DVTs, damage to bowel, bladder, ureters. She wishes to proceed.

## 2024-01-07 ENCOUNTER — Ambulatory Visit: Payer: PRIVATE HEALTH INSURANCE | Admitting: Obstetrics and Gynecology

## 2024-01-14 ENCOUNTER — Encounter: Payer: Self-pay | Admitting: Oncology

## 2024-01-14 ENCOUNTER — Inpatient Hospital Stay: Payer: PRIVATE HEALTH INSURANCE | Admitting: Oncology

## 2024-01-14 ENCOUNTER — Inpatient Hospital Stay: Payer: PRIVATE HEALTH INSURANCE | Attending: Oncology

## 2024-01-30 ENCOUNTER — Telehealth: Payer: Self-pay | Admitting: Obstetrics & Gynecology

## 2024-01-30 NOTE — Telephone Encounter (Signed)
 Patient aware that her procedure scheduled on 02/09/2024 has been rescheduled due to provider availability this day. Patient has agreed to reschedule date of 03/01/2024. Patient aware that pre admit appointment has been cancelled and that she will be contacted for rescheduling date. Patient voiced understanding.

## 2024-02-02 ENCOUNTER — Other Ambulatory Visit: Payer: PRIVATE HEALTH INSURANCE

## 2024-02-23 ENCOUNTER — Encounter
Admission: RE | Admit: 2024-02-23 | Discharge: 2024-02-23 | Disposition: A | Discharge status: Home / Self Care | Payer: PRIVATE HEALTH INSURANCE | Source: Ambulatory Visit | Attending: Obstetrics & Gynecology | Admitting: Obstetrics & Gynecology

## 2024-02-23 ENCOUNTER — Other Ambulatory Visit: Payer: Self-pay

## 2024-02-23 VITALS — Wt 135.0 lb

## 2024-02-23 DIAGNOSIS — E876 Hypokalemia: Secondary | ICD-10-CM

## 2024-02-23 DIAGNOSIS — D563 Thalassemia minor: Secondary | ICD-10-CM

## 2024-02-23 DIAGNOSIS — D649 Anemia, unspecified: Secondary | ICD-10-CM

## 2024-02-23 DIAGNOSIS — Z3009 Encounter for other general counseling and advice on contraception: Secondary | ICD-10-CM

## 2024-02-23 DIAGNOSIS — Z01812 Encounter for preprocedural laboratory examination: Secondary | ICD-10-CM

## 2024-02-23 DIAGNOSIS — Z01818 Encounter for other preprocedural examination: Secondary | ICD-10-CM

## 2024-02-23 HISTORY — DX: Hormone receptor negative with human epidermal growth factor receptor 2 negative status: Z17.421

## 2024-02-23 HISTORY — DX: Solitary pulmonary nodule: R91.1

## 2024-02-23 HISTORY — DX: Deficiency of other specified B group vitamins: E53.8

## 2024-02-23 HISTORY — DX: Acquired absence of right breast and nipple: Z90.11

## 2024-02-23 HISTORY — DX: Thalassemia minor: D56.3

## 2024-02-23 HISTORY — DX: Liver disease, unspecified: K76.9

## 2024-02-23 NOTE — Patient Instructions (Signed)
 Your procedure is scheduled on:03-01-24 Monday Report to the Registration Desk on the 1st floor of the [facility].Then proceed to the 2nd floor Surgery Desk To find out your arrival time, please call [phone] between 1PM - 3PM on:02-27-24 Friday If your arrival time is 6:00 am, do not arrive before that time as the [facility] entrance doors do not open until 6:00 am.  REMEMBER: Instructions that are not followed completely may result in serious medical risk, up to and including death; or upon the discretion of your surgeon and anesthesiologist your surgery may need to be rescheduled.  Do not eat food OR liquids after midnight the night before surgery.  No gum chewing or hard candies.  One week prior to surgery:Stop NOW (02-23-24) Stop Anti-inflammatories (NSAIDS) such as Advil , Aleve, Ibuprofen , Motrin , Naproxen, Naprosyn and Aspirin based products such as Excedrin, Goody's Powder, BC Powder. Stop ANY OVER THE COUNTER supplements until after surgery (Multivitamin)  You may however, continue to take Tylenol  if needed for pain up until the day of surgery.  Do NOT take any medication the day of surgery  No Alcohol for 24 hours before or after surgery.  No Smoking including e-cigarettes for 24 hours before surgery.  No chewable tobacco products for at least 6 hours before surgery.  No nicotine  patches on the day of surgery.  Do not use any recreational drugs for at least a week (preferably 2 weeks) before your surgery.  Please be advised that the combination of cocaine and anesthesia may have negative outcomes, up to and including death. If you test positive for cocaine, your surgery will be cancelled.  On the morning of surgery brush your teeth with toothpaste and water, you may rinse your mouth with mouthwash if you wish. Do not swallow any toothpaste or mouthwash.  Use CHG Soap as directed on instruction sheet.  Do not wear jewelry, make-up, hairpins, clips or nail  polish.  For welded (permanent) jewelry: bracelets, anklets, waist bands, etc.  Please have this removed prior to surgery.  If it is not removed, there is a chance that hospital personnel will need to cut it off on the day of surgery.  Do not wear lotions, powders, or perfumes.   Do not shave body hair from the neck down 48 hours before surgery.  Contact lenses, hearing aids and dentures may not be worn into surgery.  Do not bring valuables to the hospital. [facility] is not responsible for any missing/lost belongings or valuables.   Notify your doctor if there is any change in your medical condition (cold, fever, infection).  Wear comfortable clothing (specific to your surgery type) to the hospital.  After surgery, you can help prevent lung complications by doing breathing exercises.  Take deep breaths and cough every 1-2 hours. Your doctor may order a device called an Incentive Spirometer to help you take deep breaths. When coughing or sneezing, hold a pillow firmly against your incision with both hands. This is called "splinting." Doing this helps protect your incision. It also decreases belly discomfort.  If you are being admitted to the hospital overnight, leave your suitcase in the car. After surgery it may be brought to your room.  In case of increased patient census, it may be necessary for you, the patient, to continue your postoperative care in the Same Day Surgery department.  If you are being discharged the day of surgery, you will not be allowed to drive home. You will need a responsible individual to  drive you home and stay with you for 24 hours after surgery.   If you are taking public transportation, you will need to have a responsible individual with you.  Please call the Pre-admissions Testing Dept. at [phone] if you have any questions about these instructions.  Surgery Visitation Policy:  Patients having surgery or a procedure may have two visitors.   Children under the age of 2 must have an adult with them who is not the patient.                                                                                                             Preparing for Surgery with CHLORHEXIDINE GLUCONATE (CHG) Soap  Chlorhexidine Gluconate (CHG) Soap  o An antiseptic cleaner that kills germs and bonds with the skin to continue killing germs even after washing  o Used for showering the night before surgery and morning of surgery  Before surgery, you can play an important role by reducing the number of germs on your skin.  CHG (Chlorhexidine gluconate) soap is an antiseptic cleanser which kills germs and bonds with the skin to continue killing germs even after washing.  Please do not use if you have an allergy to CHG or antibacterial soaps. If your skin becomes reddened/irritated stop using the CHG.  1. Shower the NIGHT BEFORE SURGERY with CHG soap.  2. If you choose to wash your hair, wash your hair first as usual with your normal shampoo.  3. After shampooing, rinse your hair and body thoroughly to remove the shampoo.  4. Use CHG as you would any other liquid soap. You can apply CHG directly to the skin and wash gently with a clean washcloth.  5. Apply the CHG soap to your body only from the neck down. Do not use on open wounds or open sores. Avoid contact with your eyes, ears, mouth, and genitals (private parts). Wash face and genitals (private parts) with your normal soap.  6. Wash thoroughly, paying special attention to the area where your surgery will be performed.  7. Thoroughly rinse your body with warm water.  8. Do not shower/wash with your normal soap after using and rinsing off the CHG soap.  9. Do not use lotions, oils, etc., after showering with CHG.  10. Pat yourself dry with a clean towel.  11. Wear clean pajamas to bed the night before surgery.  12. Place clean sheets on your bed the night of your shower and do not sleep  with pets.  13. Do not apply any deodorants/lotions/powders.  14. Please wear clean clothes to the hospital.  15. Remember to brush your teeth with your regular toothpaste.   Merchandiser, Retail to address health-related social needs:  [email or web address]

## 2024-02-23 NOTE — Addendum Note (Signed)
 Addended by: STARLA HARLAND BROCKS on: 02/23/2024 03:07 PM   Modules accepted: Orders

## 2024-02-27 ENCOUNTER — Encounter
Admission: RE | Admit: 2024-02-27 | Discharge: 2024-02-27 | Disposition: A | Discharge status: Home / Self Care | Payer: PRIVATE HEALTH INSURANCE | Source: Ambulatory Visit | Attending: Obstetrics & Gynecology | Admitting: Obstetrics & Gynecology

## 2024-02-27 DIAGNOSIS — E876 Hypokalemia: Secondary | ICD-10-CM | POA: Insufficient documentation

## 2024-02-27 DIAGNOSIS — D563 Thalassemia minor: Secondary | ICD-10-CM | POA: Insufficient documentation

## 2024-02-27 DIAGNOSIS — D649 Anemia, unspecified: Secondary | ICD-10-CM | POA: Insufficient documentation

## 2024-02-27 DIAGNOSIS — Z01812 Encounter for preprocedural laboratory examination: Secondary | ICD-10-CM | POA: Insufficient documentation

## 2024-02-27 LAB — TYPE AND SCREEN
ABO/RH(D): B POS
Antibody Screen: NEGATIVE

## 2024-02-27 LAB — BASIC METABOLIC PANEL WITH GFR
Anion gap: 10 (ref 5–15)
BUN: 16 mg/dL (ref 6–20)
CO2: 25 mmol/L (ref 22–32)
Calcium: 9.3 mg/dL (ref 8.9–10.3)
Chloride: 104 mmol/L (ref 98–111)
Creatinine, Ser: 0.79 mg/dL (ref 0.44–1.00)
GFR, Estimated: >60 mL/min (ref >60)
Glucose, Bld: 99 mg/dL (ref 70–99)
Potassium: 3.5 mmol/L (ref 3.5–5.1)
Sodium: 139 mmol/L (ref 135–145)

## 2024-02-27 LAB — CBC
HCT: 32.9 % — ABNORMAL LOW (ref 36.0–46.0)
Hemoglobin: 10.2 g/dL — ABNORMAL LOW (ref 12.0–15.0)
MCH: 22.6 pg — ABNORMAL LOW (ref 26.0–34.0)
MCHC: 31 g/dL (ref 30.0–36.0)
MCV: 72.8 fL — ABNORMAL LOW (ref 80.0–100.0)
Platelets: 352 K/uL (ref 150–400)
RBC: 4.52 MIL/uL (ref 3.87–5.11)
RDW: 14 % (ref 11.5–15.5)
WBC: 5 K/uL (ref 4.0–10.5)
nRBC: 0 % (ref 0.0–0.2)

## 2024-02-29 MED ORDER — POVIDONE-IODINE 10 % EX SWAB: 2.0000 | Freq: Once | CUTANEOUS | Status: DC

## 2024-02-29 MED ORDER — CHLORHEXIDINE GLUCONATE 0.12 % MT SOLN
15.0000 mL | Freq: Once | OROMUCOSAL | Status: AC
  Administered 2024-03-01: 15 mL via OROMUCOSAL

## 2024-02-29 MED ORDER — DEXTROSE IN LACTATED RINGERS 5 % IV SOLN: INTRAVENOUS | Status: DC

## 2024-02-29 MED ORDER — LACTATED RINGERS IV SOLN: INTRAVENOUS | Status: DC

## 2024-02-29 MED ORDER — ORAL CARE MOUTH RINSE: 15.0000 mL | Freq: Once | OROMUCOSAL | Status: AC

## 2024-03-01 ENCOUNTER — Ambulatory Visit: Payer: PRIVATE HEALTH INSURANCE

## 2024-03-01 ENCOUNTER — Ambulatory Visit
Admission: RE | Admit: 2024-03-01 | Discharge: 2024-03-01 | Disposition: A | Discharge status: Home / Self Care | Payer: PRIVATE HEALTH INSURANCE | Attending: Obstetrics & Gynecology | Admitting: Obstetrics & Gynecology

## 2024-03-01 ENCOUNTER — Encounter: Payer: Self-pay | Admitting: Obstetrics & Gynecology

## 2024-03-01 ENCOUNTER — Encounter
Admission: RE | Disposition: A | Discharge status: Home / Self Care | Payer: Self-pay | Source: Home / Self Care | Attending: Obstetrics & Gynecology

## 2024-03-01 ENCOUNTER — Ambulatory Visit: Payer: PRIVATE HEALTH INSURANCE | Admitting: Urgent Care

## 2024-03-01 ENCOUNTER — Other Ambulatory Visit: Payer: Self-pay

## 2024-03-01 DIAGNOSIS — Z302 Encounter for sterilization: Secondary | ICD-10-CM | POA: Diagnosis present

## 2024-03-01 DIAGNOSIS — Z3009 Encounter for other general counseling and advice on contraception: Secondary | ICD-10-CM

## 2024-03-01 DIAGNOSIS — Z01812 Encounter for preprocedural laboratory examination: Secondary | ICD-10-CM

## 2024-03-01 DIAGNOSIS — Z01818 Encounter for other preprocedural examination: Secondary | ICD-10-CM

## 2024-03-01 HISTORY — PX: LAPAROSCOPIC BILATERAL SALPINGECTOMY: SHX5889

## 2024-03-01 LAB — POCT PREGNANCY, URINE: Preg Test, Ur: NEGATIVE

## 2024-03-01 SURGERY — SALPINGECTOMY, BILATERAL, LAPAROSCOPIC
Anesthesia: General | Laterality: Bilateral

## 2024-03-01 MED ORDER — PROPOFOL 10 MG/ML IV BOLUS
INTRAVENOUS | Status: DC | PRN
  Administered 2024-03-01: 150 mg via INTRAVENOUS
  Administered 2024-03-01: 150 ug/kg/min via INTRAVENOUS

## 2024-03-01 MED ORDER — ONDANSETRON HCL 4 MG/2ML IJ SOLN
INTRAMUSCULAR | Status: DC | PRN
  Administered 2024-03-01: 4 mg via INTRAVENOUS

## 2024-03-01 MED ORDER — OXYCODONE HCL 5 MG PO TABS
5.0000 mg | ORAL_TABLET | Freq: Once | ORAL | Status: AC | PRN
  Administered 2024-03-01: 5 mg via ORAL

## 2024-03-01 MED ORDER — PROPOFOL 1000 MG/100ML IV EMUL
INTRAVENOUS | Status: AC
  Filled 2024-03-01: qty 100

## 2024-03-01 MED ORDER — ROCURONIUM BROMIDE 100 MG/10ML IV SOLN
INTRAVENOUS | Status: DC | PRN
  Administered 2024-03-01: 50 mg via INTRAVENOUS

## 2024-03-01 MED ORDER — ROCURONIUM BROMIDE 10 MG/ML (PF) SYRINGE
PREFILLED_SYRINGE | INTRAVENOUS | Status: AC
  Filled 2024-03-01: qty 10

## 2024-03-01 MED ORDER — LIDOCAINE HCL (PF) 2 % IJ SOLN
INTRAMUSCULAR | Status: AC
Start: 2024-03-01 — End: 2024-03-01
  Filled 2024-03-01: qty 5

## 2024-03-01 MED ORDER — CHLORHEXIDINE GLUCONATE 0.12 % MT SOLN
OROMUCOSAL | Status: AC
Start: 2024-03-01 — End: 2024-03-01
  Filled 2024-03-01: qty 15

## 2024-03-01 MED ORDER — PROPOFOL 10 MG/ML IV BOLUS
INTRAVENOUS | Status: AC
  Filled 2024-03-01: qty 20

## 2024-03-01 MED ORDER — BUPIVACAINE HCL (PF) 0.5 % IJ SOLN
INTRAMUSCULAR | Status: AC
Start: 2024-03-01 — End: 2024-03-01
  Filled 2024-03-01: qty 30

## 2024-03-01 MED ORDER — SODIUM CHLORIDE 0.9 % IR SOLN
Status: DC | PRN
  Administered 2024-03-01: 500 mL

## 2024-03-01 MED ORDER — DEXMEDETOMIDINE HCL IN NACL 80 MCG/20ML IV SOLN
INTRAVENOUS | Status: DC | PRN
  Administered 2024-03-01 (×2): 8 ug via INTRAVENOUS

## 2024-03-01 MED ORDER — FENTANYL CITRATE (PF) 100 MCG/2ML IJ SOLN
INTRAMUSCULAR | Status: AC
  Filled 2024-03-01: qty 2

## 2024-03-01 MED ORDER — ONDANSETRON HCL 4 MG/2ML IJ SOLN
INTRAMUSCULAR | Status: AC
  Filled 2024-03-01: qty 2

## 2024-03-01 MED ORDER — SUGAMMADEX SODIUM 200 MG/2ML IV SOLN
INTRAVENOUS | Status: DC | PRN
  Administered 2024-03-01: 200 mg via INTRAVENOUS

## 2024-03-01 MED ORDER — MIDAZOLAM HCL 2 MG/2ML IJ SOLN
INTRAMUSCULAR | Status: AC
  Filled 2024-03-01: qty 2

## 2024-03-01 MED ORDER — OXYCODONE HCL 5 MG/5ML PO SOLN: 5.0000 mg | Freq: Once | ORAL | Status: AC | PRN

## 2024-03-01 MED ORDER — BUPIVACAINE HCL (PF) 0.5 % IJ SOLN
INTRAMUSCULAR | Status: DC | PRN
  Administered 2024-03-01: 8 mL
  Administered 2024-03-01: 22 mL

## 2024-03-01 MED ORDER — LIDOCAINE HCL (CARDIAC) PF 100 MG/5ML IV SOSY
PREFILLED_SYRINGE | INTRAVENOUS | Status: DC | PRN
  Administered 2024-03-01: 40 mg via INTRAVENOUS

## 2024-03-01 MED ORDER — OXYCODONE HCL 5 MG PO TABS
ORAL_TABLET | ORAL | Status: AC
  Filled 2024-03-01: qty 1

## 2024-03-01 MED ORDER — FENTANYL CITRATE (PF) 100 MCG/2ML IJ SOLN
INTRAMUSCULAR | Status: DC | PRN
  Administered 2024-03-01 (×2): 50 ug via INTRAVENOUS

## 2024-03-01 MED ORDER — OXYCODONE HCL 5 MG PO TABS: 5.0000 mg | ORAL_TABLET | Freq: Three times a day (TID) | ORAL | 0 refills | Status: AC | PRN

## 2024-03-01 MED ORDER — ACETAMINOPHEN 325 MG PO TABS: 650.0000 mg | ORAL_TABLET | ORAL | 2 refills | Status: AC | PRN

## 2024-03-01 MED ORDER — DEXAMETHASONE SOD PHOSPHATE PF 10 MG/ML IJ SOLN
INTRAMUSCULAR | Status: DC | PRN
  Administered 2024-03-01: 10 mg via INTRAVENOUS

## 2024-03-01 MED ORDER — FENTANYL CITRATE (PF) 100 MCG/2ML IJ SOLN: 25.0000 ug | INTRAMUSCULAR | Status: DC | PRN

## 2024-03-01 MED ORDER — MIDAZOLAM HCL (PF) 2 MG/2ML IJ SOLN
INTRAMUSCULAR | Status: DC | PRN
  Administered 2024-03-01: 2 mg via INTRAVENOUS

## 2024-03-01 MED ORDER — IBUPROFEN 600 MG PO TABS: 600.0000 mg | ORAL_TABLET | Freq: Four times a day (QID) | ORAL | 1 refills | Status: AC | PRN

## 2024-03-01 SURGICAL SUPPLY — 27 items
BLADE SURG SZ11 CARB STEEL (BLADE) ×1 IMPLANT
CHLORAPREP W/TINT 26 (MISCELLANEOUS) ×1 IMPLANT
DEFOGGER SCOPE WARM SEASHARP (MISCELLANEOUS) ×1 IMPLANT
ELECTRODE REM PT RTRN 9FT ADLT (ELECTROSURGICAL) ×1 IMPLANT
GLOVE BIO SURGEON STRL SZ 6.5 (GLOVE) ×2 IMPLANT
GLOVE BIOGEL PI IND STRL 7.0 (GLOVE) ×2 IMPLANT
GOWN STRL REUS W/ TWL LRG LVL3 (GOWN DISPOSABLE) ×2 IMPLANT
IRRIGATION STRYKERFLOW (MISCELLANEOUS) ×1 IMPLANT
KIT PINK PAD W/HEAD ARM REST (MISCELLANEOUS) IMPLANT
NDL INSUFFLATION 14GA 120MM (NEEDLE) ×1 IMPLANT
NEEDLE INSUFFLATION 14GA 120MM (NEEDLE) ×1 IMPLANT
NS IRRIG 500ML POUR BTL (IV SOLUTION) ×1 IMPLANT
PACK GYN LAPAROSCOPIC (MISCELLANEOUS) ×1 IMPLANT
PAD PREP OB/GYN DISP 24X41 (PERSONAL CARE ITEMS) ×1 IMPLANT
SCRUB CHG 4% DYNA-HEX 4OZ (MISCELLANEOUS) ×1 IMPLANT
SHEARS HARMONIC 36 ACE (MISCELLANEOUS) ×1 IMPLANT
SLEEVE Z-THREAD 5X100MM (TROCAR) ×2 IMPLANT
STRIP CLOSURE SKIN 1/2X4 (GAUZE/BANDAGES/DRESSINGS) IMPLANT
STRIP CLOSURE SKIN 1/4X4 (GAUZE/BANDAGES/DRESSINGS) ×1 IMPLANT
SUT VIC AB 2-0 SH 27XBRD (SUTURE) IMPLANT
SUT VIC AB 4-0 PS2 27 (SUTURE) ×1 IMPLANT
SUT VICRYL 0 UR6 27IN ABS (SUTURE) IMPLANT
SYR 30ML LL (SYRINGE) ×1 IMPLANT
SYSTEM RETRIEVL 5MM INZII UNIV (BASKET) IMPLANT
TOWEL OR 17X26 4PK STRL BLUE (TOWEL DISPOSABLE) ×2 IMPLANT
TROCAR Z-THREAD FIOS 5X100MM (TROCAR) ×2 IMPLANT
TUBING EVAC SMOKE HEATED PNEUM (TUBING) ×1 IMPLANT

## 2024-03-01 NOTE — Op Note (Addendum)
 03/01/2024  11:47 AM  PATIENT:  Angel Pacheco  33 y.o. female  PRE-OPERATIVE DIAGNOSIS:  unwanted fertility  POST-OPERATIVE DIAGNOSIS:  unwanted fertility  PROCEDURE:  Procedure(s): SALPINGECTOMY, BILATERAL, LAPAROSCOPIC (Bilateral)  SURGEON:  Surgeons and Role:    * Marea Reasner, Harland BROCKS, MD - Primary    * Leigh Sober, MD - Assisting  ANESTHESIA:   local and general  EBL:  minimal  BLOOD ADMINISTERED:none  DRAINS: none   LOCAL MEDICATIONS USED:  MARCAINE      SPECIMEN:  Source of Specimen:  oviducts  DISPOSITION OF SPECIMEN:  PATHOLOGY  COUNTS:  YES  TOURNIQUET:  * No tourniquets in log *  DICTATION: .Note written in EPIC  PLAN OF CARE: Discharge to home after PACU  PATIENT DISPOSITION:  PACU - hemodynamically stable.   Delay start of Pharmacological VTE agent (>24hrs) due to surgical blood loss or risk of bleeding: not applicable    The risks, benefits, and alternatives of surgery were explained, accepted, and understood. Consents were signed. She was taken to the operating room and placed in the dorsal lithotomy position. When she was comfortable, general anesthesia was applied without complication. Her abdomen and vagina were prepped and draped in the usual sterile fashion. A bimanual exam revealed a uterus that is normal size and shape, anteverted uterus, normal adnexal exam A Hulka manipulator was placed on her cervix. Gown and gloves were changed.  0.5% Marcaine  was used to inject at all the incision sites prior to making an incision. A 5 mm incision was made in the umbilicus. A varies needle was placed intraperitoneally. Low-flow CO2 was used to insufflate the abdomen to approximately 3 L. Patient pressure was always less than 15 mm Hg. After an excellent pneumoperitoneum was established a 5 mm trocar was placed. Laparoscopy confirmed correct placement. Under direct laparoscopic visualization I placed a 5 mm port in both lower quadrant taking care to avoid blood vessels.   She was placed in the Trendelenburg position. The pelvis was inspected. Both ovaries appeared normal as did the liver and gallbladder. Both oviducts were excised using the harmonic scalpel taking care to avoid surrounding structures.  Hemostasis was maintained. The oviducts were removed. The CO2 was allowed to escape from the abdomen and the ports were removed. A subcuticular closure was done with 4-0 Vicryl at all of the skin incisions. Steri-Strips were placed across the incisions. She was extubated and taken to the recovery room in stable condition.   An experienced assistant was required given the standard of surgical care given the complexity of the case. This assistant was needed for exposure, dissection, suctioning, retraction, instrument exchange, assisting with delivery with administration of fundal pressure, and for overall help during the procedure.

## 2024-03-01 NOTE — H&P (Signed)
 Angel Pacheco is an 33 yo married P4 here for laparoscopic bilateral salpingectomy. She is 100% sure that she does not want another pregnancy. Her husband is not willing to get a vasectomy. She has never had a surgery in her abdomen. She is using withdrawal for contraception.   Patient's last menstrual period was 02/13/2024 (exact date).    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 She has been exposed to tobacco smoke. She has never used smokeless tobacco. She reports current alcohol use. She reports that she does not use drugs.  Allergies: No Known Allergies  Medications Prior to Admission  Medication Sig Dispense Refill Last Dose/Taking   Multiple Vitamin (MULTIVITAMIN PO) Take 1 tablet by mouth daily.   Past Week   [EXPIRED] nitrofurantoin, macrocrystal-monohydrate, (MACROBID) 100 MG capsule Take 100 mg by mouth 2 (two) times daily. for 7 days   Taking   tetrahydrozoline-zinc (VISINE-AC) 0.05-0.25 % ophthalmic solution Place 2 drops into both eyes as needed (itchy eyes).   Past Month    Review of Systems  Blood pressure 128/72, pulse 80, temperature (!) 97.1 F (36.2 C), resp. rate 15, height 5' 1 (1.549 m), weight 61.2 kg, last menstrual period 02/13/2024, SpO2 100%, not currently breastfeeding. Physical Exam General:  alert    Breasts:  inspection negative, no nipple discharge or bleeding, no masses or nodularity palpable  Lungs: clear to auscultation bilaterally  Heart:  regular rate and rhythm, S1, S2 normal, no murmur, click, rub or gallop  Abdomen: soft, non-tender; bowel sounds normal; no masses,  no organomegaly      Results for orders placed or performed during the hospital encounter of 03/01/24 (from the past 24 hours)  Pregnancy, urine POC     Status: None   Collection Time: 03/01/24  7:42 AM  Result Value Ref Range   Preg Test, Ur NEGATIVE NEGATIVE      Assessment/Plan: Unwanted fertility Plan for laparoscopic bilateral salpingectomy  She understands the risks of surgery, including, but not to infection, bleeding, DVTs, damage to bowel, bladder, ureters. She wishes to proceed.    Jamella Grayer C Arin Vanosdol 03/01/2024, 10:09 AM

## 2024-03-01 NOTE — Transfer of Care (Signed)
 Immediate Anesthesia Transfer of Care Note  Patient: Angel Pacheco  Procedure(s) Performed: SALPINGECTOMY, BILATERAL, LAPAROSCOPIC (Bilateral)  Patient Location: PACU  Anesthesia Type:General  Level of Consciousness: drowsy  Airway & Oxygen Therapy: Patient Spontanous Breathing and Patient connected to face mask oxygen  Post-op Assessment: Report given to RN and Post -op Vital signs reviewed and stable  Post vital signs: Reviewed and stable  Last Vitals:  Vitals Value Taken Time  BP 105/63   Temp    Pulse 75 03/01/24 12:01  Resp 25 03/01/24 12:01  SpO2 100 % 03/01/24 12:01  Vitals shown include unfiled device data.  Last Pain:  Vitals:   03/01/24 0804  PainSc: 0-No pain         Complications: No notable events documented.

## 2024-03-01 NOTE — Anesthesia Preprocedure Evaluation (Signed)
 Anesthesia Evaluation  Patient identified by MRN, date of birth, ID band Patient awake    Reviewed: Allergy & Precautions, NPO status , Patient's Chart, lab work & pertinent test results  History of Anesthesia Complications Negative for: history of anesthetic complications  Airway Mallampati: III  TM Distance: >3 FB Neck ROM: full    Dental  (+) Chipped   Pulmonary neg pulmonary ROS, neg shortness of breath   Pulmonary exam normal        Cardiovascular Exercise Tolerance: Good negative cardio ROS Normal cardiovascular exam     Neuro/Psych negative neurological ROS  negative psych ROS   GI/Hepatic negative GI ROS, Neg liver ROS,neg GERD  ,,  Endo/Other  negative endocrine ROS    Renal/GU      Musculoskeletal   Abdominal   Peds  Hematology negative hematology ROS (+)   Anesthesia Other Findings Past Medical History: No date: Allergic rhinitis No date: Anemia 11/06/2023: GBS (group B Streptococcus carrier), +RV culture,  currently pregnant No date: H/O right mastectomy No date: Liver lesion No date: Lung nodule No date: Raynaud phenomenon No date: Thalassemia alpha carrier No date: 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) No date: Vitamin B 12 deficiency  Past Surgical History: 10/2021: MASTECTOMY; Right  BMI    Body Mass Index: 25.49 kg/m      Reproductive/Obstetrics negative OB ROS                              Anesthesia Physical Anesthesia Plan  ASA: 2  Anesthesia Plan: General ETT   Post-op Pain Management:    Induction: Intravenous  PONV Risk Score and Plan: Ondansetron , Dexamethasone, Midazolam and Treatment may vary due to age or medical condition  Airway Management Planned: Oral ETT  Additional Equipment:   Intra-op Plan:   Post-operative Plan: Extubation in OR  Informed Consent: I have reviewed the patients History and Physical, chart, labs and discussed  the procedure including the risks, benefits and alternatives for the proposed anesthesia with the patient or authorized representative who has indicated his/her understanding and acceptance.     Dental Advisory Given  Plan Discussed with: Anesthesiologist, CRNA and Surgeon  Anesthesia Plan Comments: (Patient reports that she has no restrictions on her arm after her mastectomy. That no nodes were taken and she had never had any lymphedema in the arm.  Patient consented for risks of anesthesia including but not limited to:  - adverse reactions to medications - damage to eyes, teeth, lips or other oral mucosa - nerve damage due to positioning  - sore throat or hoarseness - Damage to heart, brain, nerves, lungs, other parts of body or loss of life  Patient voiced understanding and assent.)        Anesthesia Quick Evaluation

## 2024-03-01 NOTE — Anesthesia Procedure Notes (Signed)
 Procedure Name: Intubation Date/Time: 03/01/2024 10:56 AM  Performed by: Duwayne Craven, CRNAPre-anesthesia Checklist: Patient identified, Emergency Drugs available, Suction available, Patient being monitored and Timeout performed Patient Re-evaluated:Patient Re-evaluated prior to induction Oxygen Delivery Method: Circle system utilized Preoxygenation: Pre-oxygenation with 100% oxygen Induction Type: IV induction Ventilation: Mask ventilation without difficulty Laryngoscope Size: McGrath and 3 Grade View: Grade I Tube size: 7.0 mm Number of attempts: 1 Airway Equipment and Method: Stylet Placement Confirmation: ETT inserted through vocal cords under direct vision, positive ETCO2 and breath sounds checked- equal and bilateral Secured at: 21 cm Tube secured with: Tape Dental Injury: Teeth and Oropharynx as per pre-operative assessment

## 2024-03-01 NOTE — Anesthesia Postprocedure Evaluation (Signed)
 Anesthesia Post Note  Patient: Angel Pacheco  Procedure(s) Performed: SALPINGECTOMY, BILATERAL, LAPAROSCOPIC (Bilateral)  Patient location during evaluation: PACU Anesthesia Type: General Level of consciousness: awake and alert Pain management: pain level controlled Vital Signs Assessment: post-procedure vital signs reviewed and stable Respiratory status: spontaneous breathing, nonlabored ventilation, respiratory function stable and patient connected to nasal cannula oxygen Cardiovascular status: blood pressure returned to baseline and stable Postop Assessment: no apparent nausea or vomiting Anesthetic complications: no   No notable events documented.   Last Vitals:  Vitals:   03/01/24 1230 03/01/24 1250  BP: 110/89 129/72  Pulse: 70 74  Resp: 17 16  Temp:  (!) 36.3 C  SpO2: 100% 100%    Last Pain:  Vitals:   03/01/24 1250  TempSrc: Temporal  PainSc: 0-No pain                 Debby Mines

## 2024-03-02 ENCOUNTER — Telehealth: Payer: Self-pay | Admitting: Obstetrics & Gynecology

## 2024-03-02 ENCOUNTER — Other Ambulatory Visit: Payer: Self-pay | Admitting: Medical Genetics

## 2024-03-02 ENCOUNTER — Encounter: Payer: Self-pay | Admitting: Obstetrics & Gynecology

## 2024-03-02 LAB — SURGICAL PATHOLOGY

## 2024-03-02 NOTE — Telephone Encounter (Signed)
 I called to check on her. She is having no problems, voiding, eating, having flatus, minimal pain.

## 2024-04-05 ENCOUNTER — Encounter: Payer: Self-pay | Admitting: Oncology

## 2024-04-08 ENCOUNTER — Other Ambulatory Visit: Payer: Self-pay

## 2024-05-18 ENCOUNTER — Other Ambulatory Visit: Payer: Self-pay | Admitting: Medical Genetics

## 2024-05-18 DIAGNOSIS — Z006 Encounter for examination for normal comparison and control in clinical research program: Secondary | ICD-10-CM
